# Patient Record
Sex: Female | Born: 1990 | Race: Black or African American | Hispanic: No | Marital: Married | Smoking: Never smoker
Health system: Southern US, Community
[De-identification: ages and names within clinical notes are randomized; demographics above are authoritative.]

## PROBLEM LIST (undated history)

## (undated) DIAGNOSIS — J45909 Unspecified asthma, uncomplicated: Secondary | ICD-10-CM

## (undated) HISTORY — PX: TUBAL LIGATION: SHX77

---
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
## 2012-07-03 ENCOUNTER — Emergency Department (HOSPITAL_COMMUNITY)
Admission: EM | Admit: 2012-07-03 | Discharge: 2012-07-03 | Disposition: A | Discharge status: Home / Self Care | Payer: Managed Care, Other (non HMO) | Attending: Emergency Medicine | Admitting: Emergency Medicine

## 2012-07-03 ENCOUNTER — Encounter (HOSPITAL_COMMUNITY): Payer: Self-pay | Admitting: Emergency Medicine

## 2012-07-03 ENCOUNTER — Emergency Department (HOSPITAL_COMMUNITY): Payer: Managed Care, Other (non HMO)

## 2012-07-03 DIAGNOSIS — Z881 Allergy status to other antibiotic agents status: Secondary | ICD-10-CM | POA: Insufficient documentation

## 2012-07-03 DIAGNOSIS — J45909 Unspecified asthma, uncomplicated: Secondary | ICD-10-CM | POA: Insufficient documentation

## 2012-07-03 DIAGNOSIS — X500XXA Overexertion from strenuous movement or load, initial encounter: Secondary | ICD-10-CM | POA: Insufficient documentation

## 2012-07-03 DIAGNOSIS — S93409A Sprain of unspecified ligament of unspecified ankle, initial encounter: Secondary | ICD-10-CM | POA: Insufficient documentation

## 2012-07-03 HISTORY — DX: Unspecified asthma, uncomplicated: J45.909

## 2012-07-03 MED ORDER — IBUPROFEN 600 MG PO TABS: 600.0000 mg | ORAL_TABLET | Freq: Four times a day (QID) | ORAL | Status: AC | PRN

## 2012-07-03 MED ORDER — HYDROCODONE-ACETAMINOPHEN 5-325 MG PO TABS
1.0000 | ORAL_TABLET | Freq: Once | ORAL | Status: AC
  Administered 2012-07-03: 1 via ORAL
  Filled 2012-07-03: qty 1

## 2012-07-03 MED ORDER — HYDROCODONE-ACETAMINOPHEN 5-325 MG PO TABS: ORAL_TABLET | ORAL | Status: AC

## 2012-07-03 NOTE — ED Provider Notes (Signed)
History     CSN: [id number]  Arrival date & time 07/03/12  2145   First MD Initiated Contact with Patient 07/03/12 2217      Chief Complaint  Patient presents with  . Ankle Pain    (Consider location/radiation/quality/duration/timing/severity/associated sxs/prior treatment) Patient is a 21 y.o. female presenting with ankle pain. The history is provided by the patient.  Ankle Pain  The incident occurred 3 to 5 hours ago. The incident occurred at home. The injury mechanism was torsion. The pain is present in the left ankle. The quality of the pain is described as aching. The pain is moderate. The pain has been constant since onset. Associated symptoms include inability to bear weight. Pertinent negatives include no numbness, no loss of motion, no muscle weakness, no loss of sensation and no tingling. She reports no foreign bodies present. The symptoms are aggravated by activity, bearing weight and palpation. She has tried nothing for the symptoms. The treatment provided no relief.    Past Medical History  Diagnosis Date  . Asthma     History reviewed. No pertinent past surgical history.  History reviewed. No pertinent family history.  History  Substance Use Topics  . Smoking status: Never Smoker   . Smokeless tobacco: Not on file  . Alcohol Use: No    OB History    Grav Para Term Preterm Abortions TAB SAB Ect Mult Living                  Review of Systems  Constitutional: Negative for fever and chills.  Genitourinary: Negative for dysuria and difficulty urinating.  Musculoskeletal: Positive for joint swelling and arthralgias.  Skin: Negative for color change and wound.  Neurological: Negative for tingling and numbness.  All other systems reviewed and are negative.    Allergies  Amoxicillin  Home Medications  No current outpatient prescriptions on file.  BP 102/68  Pulse 115  Temp 98.6 F (37 C) (Oral)  Resp 24  Ht 5\' 4"  (1.626 m)  Wt 100 lb (45.36 kg)   BMI 17.17 kg/m2  SpO2 96%  Physical Exam  Nursing note and vitals reviewed. Constitutional: She is oriented to person, place, and time. She appears well-developed and well-nourished. No distress.  HENT:  Head: Normocephalic and atraumatic.  Cardiovascular: Normal rate, regular rhythm, normal heart sounds and intact distal pulses.   Pulmonary/Chest: Effort normal and breath sounds normal.  Musculoskeletal: She exhibits edema and tenderness.       Left ankle: She exhibits swelling. She exhibits normal range of motion, no ecchymosis, no deformity, no laceration and normal pulse. tenderness. Lateral malleolus tenderness found. No head of 5th metatarsal and no proximal fibula tenderness found. Achilles tendon normal.       Feet:       ttp of the lateral left ankle, mild STS is present.  ROM is preserved.  DP pulse is brisk, sensation intact.  No erythema, abrasion, bruising or deformity.    Neurological: She is alert and oriented to person, place, and time. She exhibits normal muscle tone. Coordination normal.  Skin: Skin is warm and dry.    ED Course  Procedures (including critical care time)  Labs Reviewed - No data to display Dg Ankle Complete Left  07/03/2012  *RADIOLOGY REPORT*  Clinical Data:  Ankle pain, injury  LEFT ANKLE COMPLETE - 3+ VIEW  Comparison:  None.  Findings:  There is no evidence of fracture, dislocation, or joint effusion.  There is no evidence of arthropathy  or other focal bone abnormality.  Soft tissues are unremarkable.  IMPRESSION: Negative.   Original Report Authenticated By: Camelia Phenes, M.D.      ASO splint applied.  Crutches given.  Pain improved, remains NV intact.     MDM    Tender to palpation lateral left ankle. Slight bruising is present. No obvious deformities, no soft tissue swelling. Likely sprain of the ankle. No proximal tenderness.  Patient agrees to elevate and apply ice and followup with Dr. Romeo Apple. The patient appears reasonably screened  and/or stabilized for discharge and I doubt any other medical condition or other [facility] requiring further screening, evaluation, or treatment in the ED at this time prior to discharge.   Prescribed: Ibuprofen Norco #10  Tyshun Tuckerman L. [place], [place] 07/05/12 2024

## 2012-07-03 NOTE — ED Notes (Signed)
Patient states she fell in a hole and twisted her left ankle. Complaining of pain with movement.

## 2012-07-06 NOTE — ED Provider Notes (Signed)
Medical screening examination/treatment/procedure(s) were performed by non-physician practitioner and as supervising physician I was immediately available for consultation/collaboration.  Bristyl Mclees, MD 07/06/12 0706 

## 2012-11-19 ENCOUNTER — Encounter (HOSPITAL_COMMUNITY): Payer: Self-pay | Admitting: Emergency Medicine

## 2012-11-19 ENCOUNTER — Emergency Department (HOSPITAL_COMMUNITY)
Admission: EM | Admit: 2012-11-19 | Discharge: 2012-11-19 | Disposition: A | Discharge status: Home / Self Care | Payer: Managed Care, Other (non HMO) | Attending: Emergency Medicine | Admitting: Emergency Medicine

## 2012-11-19 ENCOUNTER — Emergency Department (HOSPITAL_COMMUNITY): Payer: Managed Care, Other (non HMO)

## 2012-11-19 DIAGNOSIS — J45909 Unspecified asthma, uncomplicated: Secondary | ICD-10-CM | POA: Insufficient documentation

## 2012-11-19 DIAGNOSIS — Z3202 Encounter for pregnancy test, result negative: Secondary | ICD-10-CM | POA: Insufficient documentation

## 2012-11-19 DIAGNOSIS — IMO0002 Reserved for concepts with insufficient information to code with codable children: Secondary | ICD-10-CM | POA: Insufficient documentation

## 2012-11-19 DIAGNOSIS — M25562 Pain in left knee: Secondary | ICD-10-CM

## 2012-11-19 DIAGNOSIS — M79609 Pain in unspecified limb: Secondary | ICD-10-CM | POA: Insufficient documentation

## 2012-11-19 NOTE — ED Notes (Signed)
Patient complaining of left knee pain x 2 days. Reports was given 800 mg ibuprofen by pmd, denies relief.
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
## 2012-11-19 NOTE — ED Provider Notes (Signed)
Medical screening examination/treatment/procedure(s) were performed by non-physician practitioner and as supervising physician I was immediately available for consultation/collaboration.  Trystin Hargrove R. [place], MD 11/19/12 2318 

## 2012-11-19 NOTE — ED Notes (Signed)
Lt knee pain for 2 days, no known injury

## 2013-03-19 ENCOUNTER — Emergency Department: Payer: Self-pay | Admitting: Emergency Medicine

## 2013-03-19 LAB — URINALYSIS, COMPLETE
Blood: NEGATIVE
Glucose,UR: NEGATIVE mg/dL (ref 0–75)
Ketone: NEGATIVE
Specific Gravity: 1.016 (ref 1.003–1.030)
Squamous Epithelial: 37
WBC UR: 8 /HPF (ref 0–5)

## 2013-03-22 LAB — URINE CULTURE

## 2013-05-10 ENCOUNTER — Emergency Department: Payer: Self-pay | Admitting: Emergency Medicine

## 2013-06-01 ENCOUNTER — Emergency Department: Payer: Self-pay | Admitting: Emergency Medicine

## 2013-07-31 ENCOUNTER — Emergency Department: Payer: Self-pay | Admitting: Emergency Medicine

## 2013-07-31 LAB — CBC
HCT: 38.6 % (ref 35.0–47.0)
MCH: 31.1 pg (ref 26.0–34.0)
MCHC: 34.5 g/dL (ref 32.0–36.0)
MCV: 90 fL (ref 80–100)
RBC: 4.27 10*6/uL (ref 3.80–5.20)

## 2013-07-31 LAB — URINALYSIS, COMPLETE
Bacteria: NONE SEEN
Bilirubin,UR: NEGATIVE
Glucose,UR: NEGATIVE mg/dL (ref 0–75)
Hyaline Cast: 2
Leukocyte Esterase: NEGATIVE
Nitrite: NEGATIVE
Ph: 6 (ref 4.5–8.0)
Protein: 25
RBC,UR: 271 /HPF (ref 0–5)
Squamous Epithelial: 6
WBC UR: 11 /HPF (ref 0–5)

## 2013-07-31 LAB — BASIC METABOLIC PANEL
Anion Gap: 6 — ABNORMAL LOW (ref 7–16)
Chloride: 105 mmol/L (ref 98–107)
Creatinine: 0.71 mg/dL (ref 0.60–1.30)
EGFR (African American): >60
EGFR (Non-African Amer.): >60
Glucose: 79 mg/dL (ref 65–99)
Osmolality: 273 (ref 275–301)
Potassium: 3.2 mmol/L — ABNORMAL LOW (ref 3.5–5.1)
Sodium: 139 mmol/L (ref 136–145)

## 2013-10-23 ENCOUNTER — Emergency Department: Payer: Self-pay | Admitting: Emergency Medicine

## 2014-04-14 ENCOUNTER — Observation Stay: Payer: Self-pay | Admitting: Obstetrics and Gynecology

## 2014-04-14 DIAGNOSIS — K649 Unspecified hemorrhoids: Secondary | ICD-10-CM | POA: Insufficient documentation

## 2014-04-14 DIAGNOSIS — H449 Unspecified disorder of globe: Secondary | ICD-10-CM | POA: Insufficient documentation

## 2014-04-14 LAB — URINALYSIS, COMPLETE
Bacteria: NONE SEEN
Bilirubin,UR: NEGATIVE
Blood: NEGATIVE
Glucose,UR: NEGATIVE mg/dL (ref 0–75)
KETONE: NEGATIVE
NITRITE: NEGATIVE
PROTEIN: NEGATIVE
Ph: 7 (ref 4.5–8.0)
RBC,UR: NONE SEEN /HPF (ref 0–5)
Specific Gravity: 1.005 (ref 1.003–1.030)
Squamous Epithelial: 4

## 2014-04-17 ENCOUNTER — Observation Stay: Payer: Self-pay | Admitting: Obstetrics and Gynecology

## 2014-12-28 IMAGING — CR RIGHT THUMB 2+V
1 series · 3 of 3 positions shown · non-contrast
Comparison: none

REASON FOR EXAM: pain
COMMENTS:

[Series 1: x finger pa right · 0.14mm/px · 3 of 3 slices shown]
[im 1/3]
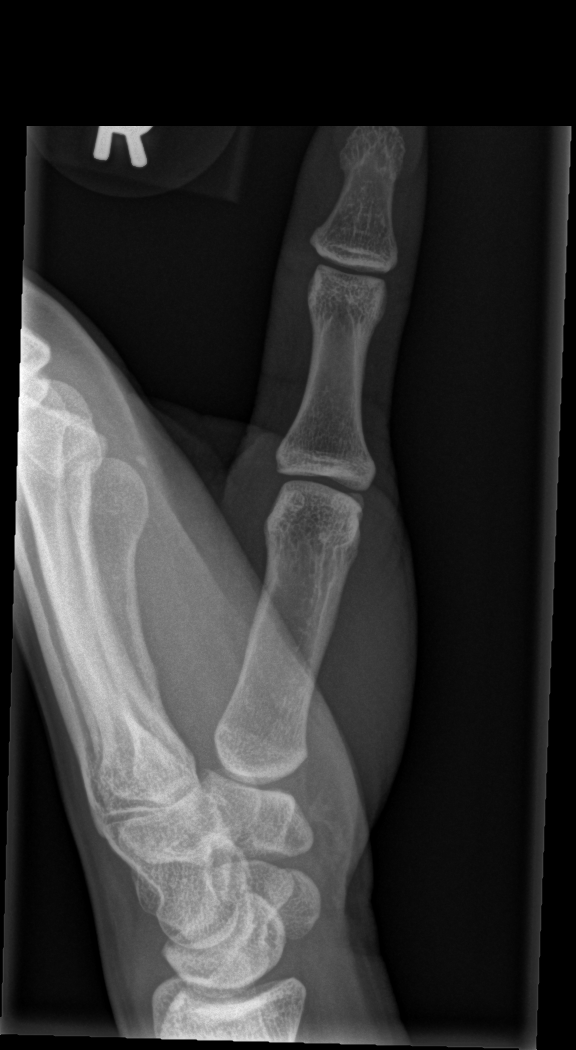
[im 2/3]
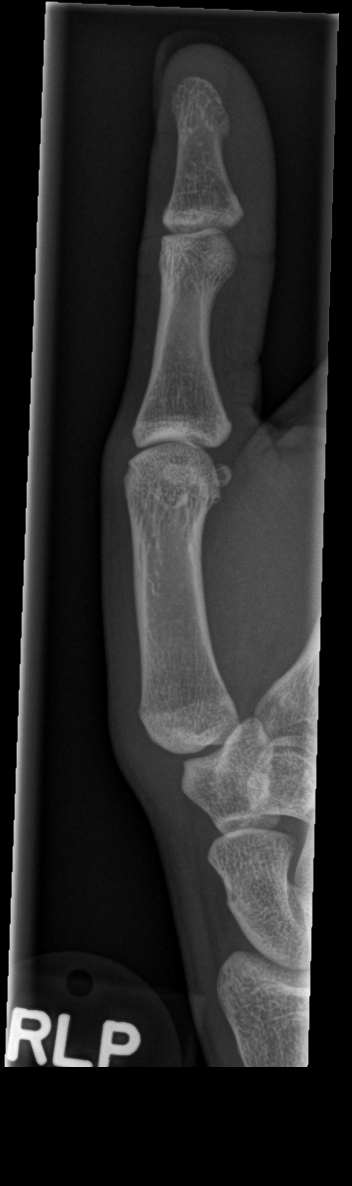
[im 3/3]
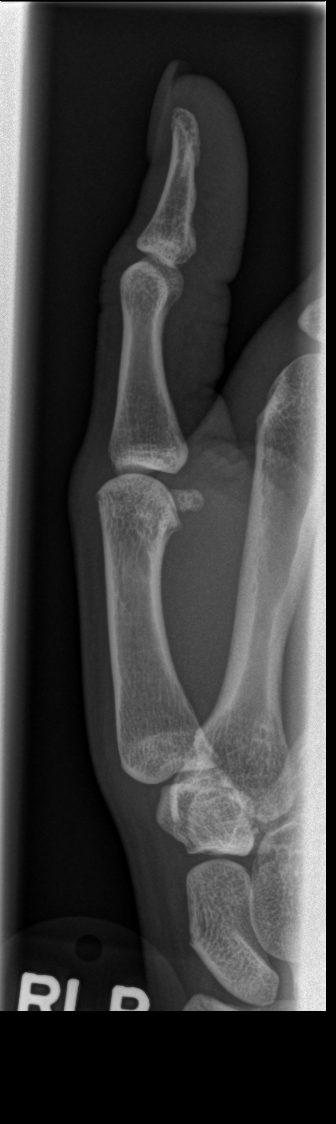

[3 of 3 positions shown; findings below may reference images not displayed]

PROCEDURE:     DXR - DXR THUMB RIGHT HAND (1ST DIGIT)  - May 10, 2013  [DATE]

RESULT:     Three views of the right thumb reveal the bones to be adequately
mineralized. There is no evidence of an acute fracture. The interphalangeal
joint, the metacarpophalangeal joint, and the carpometacarpal joint appear
normal. The overlying soft tissues are normal in appearance.
IMPRESSION: Normal 3 view series of the right thumb.

## 2015-03-24 NOTE — H&P (Signed)
L&D Evaluation:  History:  HPI 22 yoG2P1001, EGA 37.3 weeks, estimated date of confinement 05/02/2014   Presents with contractions   Patient's Medical History No Chronic Illness  GBS+   Patient's Surgical History none   Medications Pre Natal Vitamins   Allergies NKDA   Social History none   Family History Non-Contributory   ROS:  ROS All systems were reviewed.  HEENT, CNS, GI, GU, Respiratory, CV, Renal and Musculoskeletal systems were found to be normal., No PIH symptoms   Exam:  Vital Signs stable   Urine Protein negative dipstick   General no apparent distress   Mental Status clear   Heart normal sinus rhythm   Abdomen gravid, non-tender   Estimated Fetal Weight Average for gestational age, 6#8   Edema no edema   Pelvic no external lesions   Mebranes Intact   FHT normal rate with no decels, Negative spontaneous CST   Ucx regular   Ucx Frequency -2 min   Skin dry   Lymph no lymphadenopathy   Other O+/atb-/NR/RI/VI/HB-/HIV-/GBS POSITIVE   Impression:  Impression early labor, GBS +   Plan:  Plan monitor contractions and for cervical change   Follow Up Appointment already scheduled   Electronic Signatures: Erek Kowal, Prentice DockerMartin A (MD)  (Signed 01-Jun-15 14:47)  Authored: L&D Evaluation   Last Updated: 01-Jun-15 14:47 by Ellery Tash, Prentice DockerMartin A (MD)

## 2015-04-26 DIAGNOSIS — Y9389 Activity, other specified: Secondary | ICD-10-CM | POA: Insufficient documentation

## 2015-04-26 DIAGNOSIS — Z880 Allergy status to penicillin: Secondary | ICD-10-CM | POA: Insufficient documentation

## 2015-04-26 DIAGNOSIS — M25562 Pain in left knee: Secondary | ICD-10-CM | POA: Diagnosis present

## 2015-04-26 DIAGNOSIS — M7052 Other bursitis of knee, left knee: Secondary | ICD-10-CM | POA: Insufficient documentation

## 2015-04-26 DIAGNOSIS — G8929 Other chronic pain: Secondary | ICD-10-CM | POA: Diagnosis not present

## 2015-04-27 ENCOUNTER — Emergency Department
Admission: EM | Admit: 2015-04-27 | Discharge: 2015-04-27 | Disposition: A | Discharge status: Home / Self Care | Payer: Managed Care, Other (non HMO) | Attending: Emergency Medicine | Admitting: Emergency Medicine

## 2015-04-27 ENCOUNTER — Encounter: Payer: Self-pay | Admitting: *Deleted

## 2015-04-27 DIAGNOSIS — M7051 Other bursitis of knee, right knee: Secondary | ICD-10-CM

## 2015-04-27 DIAGNOSIS — M25561 Pain in right knee: Secondary | ICD-10-CM

## 2015-04-27 MED ORDER — HYDROCODONE-ACETAMINOPHEN 5-325 MG PO TABS
ORAL_TABLET | ORAL | Status: AC
  Filled 2015-04-27: qty 1

## 2015-04-27 MED ORDER — IBUPROFEN 600 MG PO TABS: 600.0000 mg | ORAL_TABLET | Freq: Three times a day (TID) | ORAL | Status: DC | PRN

## 2015-04-27 MED ORDER — IBUPROFEN 600 MG PO TABS
ORAL_TABLET | ORAL | Status: AC
  Filled 2015-04-27: qty 1

## 2015-04-27 MED ORDER — IBUPROFEN 600 MG PO TABS
600.0000 mg | ORAL_TABLET | Freq: Once | ORAL | Status: AC
  Administered 2015-04-27: 600 mg via ORAL

## 2015-04-27 MED ORDER — HYDROCODONE-ACETAMINOPHEN 5-325 MG PO TABS: 1.0000 | ORAL_TABLET | Freq: Four times a day (QID) | ORAL | Status: DC | PRN

## 2015-04-27 MED ORDER — HYDROCODONE-ACETAMINOPHEN 5-325 MG PO TABS
1.0000 | ORAL_TABLET | Freq: Once | ORAL | Status: AC
  Administered 2015-04-27: 1 via ORAL

## 2015-04-27 NOTE — ED Notes (Signed)
Pt presents w/ exacerbation of chronic knee complaint. Pt states swelling to posterior L knee and L ankle are worse. Pt states she has been to physical therapy and it is not improving. Pt c/o pain related to swelling. Pt is ambulatory w/o acute distress in triage.

## 2015-04-27 NOTE — ED Provider Notes (Signed)
[facility] Emergency Department Provider Note  ____________________________________________  Time seen: Approximately 5:01 AM  I have reviewed the triage vital signs and the nursing notes.   HISTORY  Chief Complaint Joint Swelling    HPI Julie Lawson is a 24 y.o. female who presents with acute on chronic left knee pain. Patient states she has been hurting "for a minute". Has been having chronic knee and ankle pain on the left side. Has been to physical therapy and thinks she exacerbated it this week when she started a new job which requires standing for prolonged periods of time. Complains of 6/10 pain and swelling to left lateral knee. Pain is worse on movement. Denies chest pain, shortness of breath, vomiting, diarrhea, weakness, numbness, tingling. Denies recent travel or surgery. Does take Depo shot.   Past Medical History  Diagnosis Date  . Asthma     There are no active problems to display for this patient.   History reviewed. No pertinent past surgical history.  Current Outpatient Rx  Name  Route  Sig  Dispense  Refill  . albuterol (PROVENTIL HFA;VENTOLIN HFA) 108 (90 BASE) MCG/ACT inhaler   Inhalation   Inhale 2 puffs into the lungs every 6 (six) hours as needed. For shortness of breath         . ibuprofen (ADVIL,MOTRIN) 200 MG tablet   Oral   Take 200-400 mg by mouth once as needed. For pain           Allergies Amoxicillin and Bee venom  History reviewed. No pertinent family history.  No family history of PE/DVT  Social History History  Substance Use Topics  . Smoking status: Never Smoker   . Smokeless tobacco: Not on file  . Alcohol Use: No    Review of Systems Constitutional: No fever/chills Eyes: No visual changes. ENT: No sore throat. Cardiovascular: Denies chest pain. Respiratory: Denies shortness of breath. Gastrointestinal: No abdominal pain.  No nausea, no vomiting.  No diarrhea.  No  constipation. Genitourinary: Negative for dysuria. Musculoskeletal: Positive for left knee and ankle pain Negative for back pain. Skin: Negative for rash. Neurological: Negative for headaches, focal weakness or numbness.  10-point ROS otherwise negative.  ____________________________________________   PHYSICAL EXAM:  VITAL SIGNS: ED Triage Vitals  Enc Vitals Group     BP 04/27/15 0001 107/70 mmHg     Pulse Rate 04/27/15 0001 77     Resp 04/27/15 0001 16     Temp 04/27/15 0001 98.3 F (36.8 C)     Temp Source 04/27/15 0001 Oral     SpO2 04/27/15 0001 100 %     Weight 04/27/15 0001 111 lb (50.349 kg)     Height 04/27/15 0001 5\' 1"  (1.549 m)     Head Cir --      Peak Flow --      Pain Score 04/27/15 0002 6     Pain Loc --      Pain Edu? --      Excl. in GC? --     Constitutional: Alert and oriented. Well appearing and in no acute distress. Eyes: Conjunctivae are normal. PERRL. EOMI. Head: Atraumatic. Nose: No congestion/rhinnorhea. Mouth/Throat: Mucous membranes are moist.  Oropharynx non-erythematous. Neck: No stridor.   Cardiovascular: Normal rate, regular rhythm. Grossly normal heart sounds.  Good peripheral circulation. Respiratory: Normal respiratory effort.  No retractions. Lungs CTAB. Gastrointestinal: Soft and nontender. No distention. No abdominal bruits. No CVA tenderness. Musculoskeletal: Left knee: Slight effusion left lateral  knee with tenderness to palpation. No calf swelling. Negative Homans sign. Full range of motion with minimal pain. Leg is supple without evidence of compartment syndrome. Limb is symmetrically warm without signs of ischemia. Neurologic:  Normal speech and language. No gross focal neurologic deficits are appreciated. Speech is normal. No gait instability. Skin:  Skin is warm, dry and intact. No rash noted. Psychiatric: Mood and affect are normal. Speech and behavior are normal.  ____________________________________________   LABS (all  labs ordered are listed, but only abnormal results are displayed)  Labs Reviewed - No data to display ____________________________________________  EKG  None ____________________________________________  RADIOLOGY  None ____________________________________________   PROCEDURES  Procedure(s) performed: None  Critical Care performed: No  ____________________________________________   INITIAL IMPRESSION / ASSESSMENT AND PLAN / ED COURSE  Pertinent labs & imaging results that were available during my care of the patient were reviewed by me and considered in my medical decision making (see chart for details).  24 year old female with acute on chronic left knee pain which is worse on movement and prolonged standing since she began a new job one week ago. Will treat with NSAIDs and analgesia; follow-up orthopedics. Strict return precautions given. Patient and spouse verbalized understanding and agree with plan of care. ____________________________________________   FINAL CLINICAL IMPRESSION(S) / ED DIAGNOSES  Final diagnoses:  Bursitis of knee, right  Knee pain, acute, right      Irean Hong, MD 04/27/15 [phone]

## 2015-04-27 NOTE — Discharge Instructions (Signed)
1. Take pain medicines as needed (Motrin/Norco #15). 2. You may remove compressive wrap for bathing and sleeping.  3. Return to the ER for worsening symptoms, increased swelling, weakness or other concerns.  Knee Pain The knee is the complex joint between your thigh and your lower leg. It is made up of bones, tendons, ligaments, and cartilage. The bones that make up the knee are:  The femur in the thigh.  The tibia and fibula in the lower leg.  The patella or kneecap riding in the groove on the lower femur. CAUSES  Knee pain is a common complaint with many causes. A few of these causes are:  Injury, such as:  A ruptured ligament or tendon injury.  Torn cartilage.  Medical conditions, such as:  Gout  Arthritis  Infections  Overuse, over training, or overdoing a physical activity. Knee pain can be minor or severe. Knee pain can accompany debilitating injury. Minor knee problems often respond well to self-care measures or get well on their own. More serious injuries may need medical intervention or even surgery. SYMPTOMS The knee is complex. Symptoms of knee problems can vary widely. Some of the problems are:  Pain with movement and weight bearing.  Swelling and tenderness.  Buckling of the knee.  Inability to straighten or extend your knee.  Your knee locks and you cannot straighten it.  Warmth and redness with pain and fever.  Deformity or dislocation of the kneecap. DIAGNOSIS  Determining what is wrong may be very straight forward such as when there is an injury. It can also be challenging because of the complexity of the knee. Tests to make a diagnosis may include:  Your caregiver taking a history and doing a physical exam.  Routine X-rays can be used to rule out other problems. X-rays will not reveal a cartilage tear. Some injuries of the knee can be diagnosed by:  Arthroscopy a surgical technique by which a small video camera is inserted through tiny incisions  on the sides of the knee. This procedure is used to examine and repair internal knee joint problems. Tiny instruments can be used during arthroscopy to repair the torn knee cartilage (meniscus).  Arthrography is a radiology technique. A contrast liquid is directly injected into the knee joint. Internal structures of the knee joint then become visible on X-ray film.  An MRI scan is a non X-ray radiology procedure in which magnetic fields and a computer produce two- or three-dimensional images of the inside of the knee. Cartilage tears are often visible using an MRI scanner. MRI scans have largely replaced arthrography in diagnosing cartilage tears of the knee.  Blood work.  Examination of the fluid that helps to lubricate the knee joint (synovial fluid). This is done by taking a sample out using a needle and a syringe. TREATMENT The treatment of knee problems depends on the cause. Some of these treatments are:  Depending on the injury, proper casting, splinting, surgery, or physical therapy care will be needed.  Give yourself adequate recovery time. Do not overuse your joints. If you begin to get sore during workout routines, back off. Slow down or do fewer repetitions.  For repetitive activities such as cycling or running, maintain your strength and nutrition.  Alternate muscle groups. For example, if you are a weight lifter, work the upper body on one day and the lower body the next.  Either tight or weak muscles do not give the proper support for your knee. Tight or weak muscles do  not absorb the stress placed on the knee joint. Keep the muscles surrounding the knee strong.  Take care of mechanical problems.  If you have flat feet, orthotics or special shoes may help. See your caregiver if you need help.  Arch supports, sometimes with wedges on the inner or outer aspect of the heel, can help. These can shift pressure away from the side of the knee most bothered by osteoarthritis.  A  brace called an "unloader" brace also may be used to help ease the pressure on the most arthritic side of the knee.  If your caregiver has prescribed crutches, braces, wraps or ice, use as directed. The acronym for this is PRICE. This means protection, rest, ice, compression, and elevation.  Nonsteroidal anti-inflammatory drugs (NSAIDs), can help relieve pain. But if taken immediately after an injury, they may actually increase swelling. Take NSAIDs with food in your stomach. Stop them if you develop stomach problems. Do not take these if you have a history of ulcers, stomach pain, or bleeding from the bowel. Do not take without your caregiver's approval if you have problems with fluid retention, heart failure, or kidney problems.  For ongoing knee problems, physical therapy may be helpful.  Glucosamine and chondroitin are over-the-counter dietary supplements. Both may help relieve the pain of osteoarthritis in the knee. These medicines are different from the usual anti-inflammatory drugs. Glucosamine may decrease the rate of cartilage destruction.  Injections of a corticosteroid drug into your knee joint may help reduce the symptoms of an arthritis flare-up. They may provide pain relief that lasts a few months. You may have to wait a few months between injections. The injections do have a small increased risk of infection, water retention, and elevated blood sugar levels.  Hyaluronic acid injected into damaged joints may ease pain and provide lubrication. These injections may work by reducing inflammation. A series of shots may give relief for as long as 6 months.  Topical painkillers. Applying certain ointments to your skin may help relieve the pain and stiffness of osteoarthritis. Ask your pharmacist for suggestions. Many over the-counter products are approved for temporary relief of arthritis pain.  In some countries, doctors often prescribe topical NSAIDs for relief of chronic conditions such as  arthritis and tendinitis. A review of treatment with NSAID creams found that they worked as well as oral medications but without the serious side effects. PREVENTION  Maintain a healthy weight. Extra pounds put more strain on your joints.  Get strong, stay limber. Weak muscles are a common cause of knee injuries. Stretching is important. Include flexibility exercises in your workouts.  Be smart about exercise. If you have osteoarthritis, chronic knee pain or recurring injuries, you may need to change the way you exercise. This does not mean you have to stop being active. If your knees ache after jogging or playing basketball, consider switching to swimming, water aerobics, or other low-impact activities, at least for a few days a week. Sometimes limiting high-impact activities will provide relief.  Make sure your shoes fit well. Choose footwear that is right for your sport.  Protect your knees. Use the proper gear for knee-sensitive activities. Use kneepads when playing volleyball or laying carpet. Buckle your seat belt every time you drive. Most shattered kneecaps occur in car accidents.  Rest when you are tired. SEEK MEDICAL CARE IF:  You have knee pain that is continual and does not seem to be getting better.  SEEK IMMEDIATE MEDICAL CARE IF:  Your knee joint  feels hot to the touch and you have a high fever. MAKE SURE YOU:   Understand these instructions.  Will watch your condition.  Will get help right away if you are not doing well or get worse. Document Released: 08/28/2007 Document Revised: 01/23/2012 Document Reviewed: 08/28/2007 [facility] Patient Information 2015 [place], [place]. This information is not intended to replace advice given to you by your health care provider. Make sure you discuss any questions you have with your health care provider.  Bursitis Bursitis is inflammation of a bursa. A bursa is a soft, fluid-filled sac. It cushions the soft tissue around a bone. Bursitis  often occurs in the bursas near the shoulders, elbows, knees, pelvis, hips, heel, and Achilles tendon.  SYMPTOMS   Pain and tenderness in the affected area. Sometimes, pain radiates into surrounding areas. Specifically, pain with movement.  Limited range of motion of the affected joint.  Sometimes, painless swelling of the bursa.  Fever (when infected). CAUSES   Injury to a joint or bursa.  Overuse or strenuous exercise of a joint.  Gout (disease with inflamed joints).  Prolonged pressure on a joint containing bursas (resting on an elbow or kneeling).  Arthritis.  Acute or chronic infection.  Calcium deposits in shoulder tendons, with degeneration of the tendon. RISK INCREASES WITH:  Vigorous, repeated, or sudden increase in athletic training or activity level.  Failure to warm up properly.  Overstretching.  Improper exercise technique.  Playing sports on [employer]. PREVENTION  Avoid injuries or overuse of muscles.  Warm up and cool down properly. Do this before and after physical activity.  Maintain proper conditioning:  Joint flexibility.  Muscle strength and endurance.  Cardiovascular fitness.  Learn and use proper technique.  Wear protective equipment. PROGNOSIS  With proper treatment, symptoms often go away within 7 to 14 days.  RELATED COMPLICATIONS   Frequent recurrence of symptoms. This can result in a chronic, repetitive problem.  Joint stiffness.  Limited joint movement.  Infection of bursa.  Chronic inflammation or scarring of bursa. TREATMENT Treatment first involves protecting and resting the bursa and its joint. You may use ice or an elastic bandage to reduce inflammation. Anti-inflammatory medicines may help resolve the swelling. If symptoms persist despite treatment, a caregiver may withdraw fluid from the bursa. They might also consider a corticosteroid injection. Sometimes, bursitis will persist in spite of nonsurgical treatment or  will become infected. These cases may require removal (surgical excision) of the bursa.  MEDICATION   If pain medicine is needed, nonsteroidal anti-inflammatory medicines, such as aspirin and ibuprofen, or other minor pain relievers, such as acetaminophen, are often recommended.  Do not take pain medicine for 7 days before surgery.  Prescription pain relievers are usually only prescribed after surgery. Use only as directed and only as much as you need.  Ointments applied to the skin may be helpful.  Corticosteroid injections may be given. This is done to reduce inflammation in the bursa. HEAT AND COLD:  Cold treatment (icing) relieves pain and reduces inflammation. Cold treatment should be applied for 10 to 15 minutes every 2 to 3 hours for inflammation and pain, and immediately after any activity that aggravates your symptoms. Use ice packs or an ice massage.  Heat treatment may be used prior to performing the stretching and strengthening activities prescribed by your caregiver, physical therapist, or athletic trainer. Use a heat pack or a warm soak. SEEK MEDICAL CARE IF:   Symptoms get worse or do not improve in 2 weeks,  despite treatment.  New, unexplained symptoms develop. (Drugs used in treatment may produce side effects.) Document Released: 10/31/2005 Document Revised: 03/17/2014 Document Reviewed: 02/12/2009 [facility] Patient Information 2015 [place], [place]. This information is not intended to replace advice given to you by your health care provider. Make sure you discuss any questions you have with your health care provider.

## 2015-12-12 ENCOUNTER — Emergency Department
Admission: EM | Admit: 2015-12-12 | Discharge: 2015-12-12 | Disposition: A | Discharge status: Home / Self Care | Payer: Managed Care, Other (non HMO) | Attending: Emergency Medicine | Admitting: Emergency Medicine

## 2015-12-12 ENCOUNTER — Encounter: Payer: Self-pay | Admitting: Emergency Medicine

## 2015-12-12 DIAGNOSIS — Z331 Pregnant state, incidental: Secondary | ICD-10-CM | POA: Insufficient documentation

## 2015-12-12 DIAGNOSIS — K6289 Other specified diseases of anus and rectum: Secondary | ICD-10-CM | POA: Diagnosis present

## 2015-12-12 DIAGNOSIS — Z880 Allergy status to penicillin: Secondary | ICD-10-CM | POA: Diagnosis not present

## 2015-12-12 DIAGNOSIS — K644 Residual hemorrhoidal skin tags: Secondary | ICD-10-CM | POA: Insufficient documentation

## 2015-12-12 MED ORDER — HYDROCORTISONE ACETATE 25 MG RE SUPP: 25.0000 mg | Freq: Two times a day (BID) | RECTAL | Status: AC

## 2015-12-12 NOTE — ED Notes (Signed)
States had hemorrhoids during pregnancy 2015.  Does not usually have bleeding from them. Today x 2 had bleeding from hemorroids x 2 when had a bowel movement. Does not have bleeding between bowel movements.

## 2015-12-12 NOTE — ED Notes (Signed)
Pt discharged to home.  Family member driving.  Discharge instructions reviewed.  Verbalized understanding.  No questions or concerns at this time.  Teach back verified.  Pt in NAD.  No items left in ED.   

## 2015-12-12 NOTE — Discharge Instructions (Signed)
Hemorrhoids °Hemorrhoids are swollen veins around the rectum or anus. There are two types of hemorrhoids:  °· Internal hemorrhoids. These occur in the veins just inside the rectum. They may poke through to the outside and become irritated and painful. °· External hemorrhoids. These occur in the veins outside the anus and can be felt as a painful swelling or hard lump near the anus. °CAUSES °· Pregnancy.   °· Obesity.   °· Constipation or diarrhea.   °· Straining to have a bowel movement.   °· Sitting for long periods on the toilet. °· Heavy lifting or other activity that caused you to strain. °· Anal intercourse. °SYMPTOMS  °· Pain.   °· Anal itching or irritation.   °· Rectal bleeding.   °· Fecal leakage.   °· Anal swelling.   °· One or more lumps around the anus.   °DIAGNOSIS  °Your caregiver may be able to diagnose hemorrhoids by visual examination. Other examinations or tests that may be performed include:  °· Examination of the rectal area with a gloved hand (digital rectal exam).   °· Examination of anal canal using a small tube (scope).   °· A blood test if you have lost a significant amount of blood. °· A test to look inside the colon (sigmoidoscopy or colonoscopy). °TREATMENT °Most hemorrhoids can be treated at home. However, if symptoms do not seem to be getting better or if you have a lot of rectal bleeding, your caregiver may perform a procedure to help make the hemorrhoids get smaller or remove them completely. Possible treatments include:  °· Placing a rubber band at the base of the hemorrhoid to cut off the circulation (rubber band ligation).   °· Injecting a chemical to shrink the hemorrhoid (sclerotherapy).   °· Using a tool to burn the hemorrhoid (infrared light therapy).   °· Surgically removing the hemorrhoid (hemorrhoidectomy).   °· Stapling the hemorrhoid to block blood flow to the tissue (hemorrhoid stapling).   °HOME CARE INSTRUCTIONS  °· Eat foods with fiber, such as whole grains, beans,  nuts, fruits, and vegetables. Ask your doctor about taking products with added fiber in them (fiber supplements). °· Increase fluid intake. Drink enough water and fluids to keep your urine clear or pale yellow.   °· Exercise regularly.   °· Go to the bathroom when you have the urge to have a bowel movement. Do not wait.   °· Avoid straining to have bowel movements.   °· Keep the anal area dry and clean. Use wet toilet paper or moist towelettes after a bowel movement.   °· Medicated creams and suppositories may be used or applied as directed.   °· Only take over-the-counter or prescription medicines as directed by your caregiver.   °· Take warm sitz baths for 15-20 minutes, 3-4 times a day to ease pain and discomfort.   °· Place ice packs on the hemorrhoids if they are tender and swollen. Using ice packs between sitz baths may be helpful.   °¨ Put ice in a plastic bag.   °¨ Place a towel between your skin and the bag.   °¨ Leave the ice on for 15-20 minutes, 3-4 times a day.   °· Do not use a donut-shaped pillow or sit on the toilet for long periods. This increases blood pooling and pain.   °SEEK MEDICAL CARE IF: °· You have increasing pain and swelling that is not controlled by treatment or medicine. °· You have uncontrolled bleeding. °· You have difficulty or you are unable to have a bowel movement. °· You have pain or inflammation outside the area of the hemorrhoids. °MAKE SURE YOU: °· Understand these instructions. °·   Will watch your condition. °· Will get help right away if you are not doing well or get worse. °  °This information is not intended to replace advice given to you by your health care provider. Make sure you discuss any questions you have with your health care provider. °  °Document Released: 10/28/2000 Document Revised: 10/17/2012 Document Reviewed: 09/04/2012 °Elsevier Interactive Patient Education ©2016 [employer] ° °High-Fiber Diet °Fiber, also called dietary fiber, is a type of carbohydrate  found in fruits, vegetables, whole grains, and beans. A high-fiber diet can have many health benefits. Your health care provider may recommend a high-fiber diet to help: °· Prevent constipation. Fiber can make your bowel movements more regular. °· Lower your cholesterol. °· Relieve hemorrhoids, uncomplicated diverticulosis, or irritable bowel syndrome. °· Prevent overeating as part of a weight-loss plan. °· Prevent heart disease, type 2 diabetes, and certain cancers. °WHAT IS MY PLAN? °The recommended daily intake of fiber includes: °· 38 grams for men under age 50. °· 30 grams for men over age 50. °· 25 grams for women under age 50. °· 21 grams for women over age 50. °You can get the recommended daily intake of dietary fiber by eating a variety of fruits, vegetables, grains, and beans. Your health care provider may also recommend a fiber supplement if it is not possible to get enough fiber through your diet. °WHAT DO I NEED TO KNOW ABOUT A HIGH-FIBER DIET? °· Fiber supplements have not been widely studied for their effectiveness, so it is better to get fiber through food sources. °· Always check the fiber content on the nutrition facts label of any prepackaged food. Look for foods that contain at least 5 grams of fiber per serving. °· Ask your dietitian if you have questions about specific foods that are related to your condition, especially if those foods are not listed in the following section. °· Increase your daily fiber consumption gradually. Increasing your intake of dietary fiber too quickly may cause bloating, cramping, or gas. °· Drink plenty of water. Water helps you to digest fiber. °WHAT FOODS CAN I EAT? °Grains °Whole-grain breads. Multigrain cereal. Oats and oatmeal. Brown rice. Barley. Bulgur wheat. Millet. Bran muffins. Popcorn. Rye wafer crackers. °Vegetables °Sweet potatoes. Spinach. Kale. Artichokes. Cabbage. Broccoli. Green peas. Carrots. Squash. °Fruits °Berries. Pears. Apples. Oranges.  Avocados. Prunes and raisins. Dried figs. °Meats and Other Protein Sources °Navy, kidney, pinto, and soy beans. Split peas. Lentils. Nuts and seeds. °Dairy °Fiber-fortified yogurt. °Beverages °Fiber-fortified soy milk. Fiber-fortified orange juice. °Other °Fiber bars. °The items listed above may not be a complete list of recommended foods or beverages. Contact your dietitian for more options. °WHAT FOODS ARE NOT RECOMMENDED? °Grains °White bread. Pasta made with refined flour. White rice. °Vegetables °Fried potatoes. Canned vegetables. Well-cooked vegetables.  °Fruits °Fruit juice. Cooked, strained fruit. °Meats and Other Protein Sources °Fatty cuts of meat. Fried poultry or fried fish. °Dairy °Milk. Yogurt. Cream cheese. Sour cream. °Beverages °Soft drinks. °Other °Cakes and pastries. Butter and oils. °The items listed above may not be a complete list of foods and beverages to avoid. Contact your dietitian for more information. °WHAT ARE SOME TIPS FOR INCLUDING HIGH-FIBER FOODS IN MY DIET? °· Eat a wide variety of high-fiber foods. °· Make sure that half of all grains consumed each day are whole grains. °· Replace breads and cereals made from refined flour or white flour with whole-grain breads and cereals. °· Replace white rice with brown rice, bulgur wheat, or millet. °· Start   the day with a breakfast that is high in fiber, such as a cereal that contains at least 5 grams of fiber per serving.  Use beans in place of meat in soups, salads, or pasta.  Eat high-fiber snacks, such as berries, raw vegetables, nuts, or popcorn.   This information is not intended to replace advice given to you by your health care provider. Make sure you discuss any questions you have with your health care provider.   Document Released: 10/31/2005 Document Revised: 11/21/2014 Document Reviewed: 04/15/2014 Elsevier Interactive Patient Education 2016 [employer].  Use the suppository as needed. Increase fiber and consider a  daily fiber supplement or Miralax as needed.

## 2015-12-13 NOTE — ED Provider Notes (Signed)
[facility] Emergency Department Provider Note ____________________________________________  Time seen: 54  I have reviewed the triage vital signs and the nursing notes.  HISTORY  Chief Complaint  Hemorrhoids  HPI Julie Lawson is a 25 y.o. female presents to the ED for evaluation of an episode of bleeding after 2 separate bowel movements today. She does have a history of hemorrhoids since her pregnancy in 2015. She does not give a history of previous bleeding hemorrhoids, nor has she had any bleeding in between bowel movements. She denies any fevers, chills, sweats, nausea, vomiting, or diarrhea. She does admit to a firm, straining stool today with the episodes of rectal pain and bleeding. She was previously evaluated and prescribed a steroid cream,but admits to not using it. She also voices some concerns following unusual bleeding last month. She reports her normal menstrual period on 12/3, and then a another shorter menstrual bleed 2 weeks later. He denies any vaginal discharge, or pelvic pain. She is currently not using any method of birth control or barrier protection. She last utilized Depo-Provera in July 2016.  Past Medical History  Diagnosis Date  . Asthma     There are no active problems to display for this patient.   History reviewed. No pertinent past surgical history.  Current Outpatient Rx  Name  Route  Sig  Dispense  Refill  . albuterol (PROVENTIL HFA;VENTOLIN HFA) 108 (90 BASE) MCG/ACT inhaler   Inhalation   Inhale 2 puffs into the lungs every 6 (six) hours as needed. For shortness of breath         . HYDROcodone-acetaminophen (NORCO) 5-325 MG per tablet   Oral   Take 1 tablet by mouth every 6 (six) hours as needed for moderate pain.   15 tablet   0   . hydrocortisone (ANUSOL-HC) 25 MG suppository   Rectal   Place 1 suppository (25 mg total) rectally every 12 (twelve) hours.   12 suppository   1   . ibuprofen (ADVIL,MOTRIN)  600 MG tablet   Oral   Take 1 tablet (600 mg total) by mouth every 8 (eight) hours as needed.   15 tablet   0    Allergies Amoxicillin and Bee venom  No family history on file.  Social History Social History  Substance Use Topics  . Smoking status: Never Smoker   . Smokeless tobacco: None  . Alcohol Use: No   Review of Systems  Constitutional: Negative for fever. Eyes: Negative for visual changes. ENT: Negative for sore throat. Cardiovascular: Negative for chest pain. Respiratory: Negative for shortness of breath. Gastrointestinal: Negative for abdominal pain, vomiting and diarrhea. Rectal pain and bleeding as above. Genitourinary: Negative for dysuria. LMP  10/17/15 Musculoskeletal: Negative for back pain. Skin: Negative for rash. Neurological: Negative for headaches, focal weakness or numbness. ____________________________________________  PHYSICAL EXAM:  VITAL SIGNS: ED Triage Vitals  Enc Vitals Group     BP 12/12/15 1703 111/71 mmHg     Pulse Rate 12/12/15 1703 73     Resp 12/12/15 1703 18     Temp 12/12/15 1703 98.1 F (36.7 C)     Temp Source 12/12/15 1703 Oral     SpO2 12/12/15 1703 100 %     Weight 12/12/15 1703 100 lb (45.36 kg)     Height 12/12/15 1703  (1.549 m)     Head Cir --      Peak Flow --      Pain Score 12/12/15 1704 5  Pain Loc --      Pain Edu? --      Excl. in GC? --    Constitutional: Alert and oriented. Well appearing and in no distress. Head: Normocephalic and atraumatic.      Eyes: Conjunctivae are normal. PERRL. Normal extraocular movements      Ears: Canals clear. TMs intact bilaterally.   Nose: No congestion/rhinorrhea.   Mouth/Throat: Mucous membranes are moist.   Neck: Supple. No thyromegaly. Hematological/Lymphatic/Immunological: No cervical lymphadenopathy. Cardiovascular: Normal rate, regular rhythm.  Respiratory: Normal respiratory effort. No wheezes/rales/rhonchi. Gastrointestinal: Soft and nontender.  No distention, rebound, guarding, or organomegaly. Normal bowel sounds. Rectal exam reveals as single, soft external hemorrhoid. Digital exam is deferred.  Musculoskeletal: Nontender with normal range of motion in all extremities.  Neurologic:  Normal gait without ataxia. Normal speech and language. No gross focal neurologic deficits are appreciated. Skin:  Skin is warm, dry and intact. No rash noted. Psychiatric: Mood and affect are normal. Patient exhibits appropriate insight and judgment. ____________________________________________   LABS (pertinent positives/negatives) Labs Reviewed  POC URINE PREG, ED   Urine Pregnancy - Positive ____________________________________________  INITIAL IMPRESSION / ASSESSMENT AND PLAN / ED COURSE  Patient is notified of the positive urine pregnancy test. By dates that gives her a gestational age of [redacted] weeks and 6 days. Her EDC is a 07/23/2016. She also has a single external hemorrhoid with some intermittent bleeding. Patient is advised to increase fiber as well as stool softeners. She did bite with a prescription for hydrocortisone suppositories to use as needed. She will follow-up with the primary care provider for ongoing prenatal care. ____________________________________________  FINAL CLINICAL IMPRESSION(S) / ED DIAGNOSES  Final diagnoses:  External hemorrhoid, bleeding      Lissa Hoard, PA-C 12/13/15 2100  Jennye Moccasin, MD 12/13/15 2156

## 2015-12-16 ENCOUNTER — Ambulatory Visit (INDEPENDENT_AMBULATORY_CARE_PROVIDER_SITE_OTHER): Payer: Managed Care, Other (non HMO) | Admitting: Obstetrics and Gynecology

## 2015-12-16 ENCOUNTER — Other Ambulatory Visit (INDEPENDENT_AMBULATORY_CARE_PROVIDER_SITE_OTHER): Payer: Managed Care, Other (non HMO)

## 2015-12-16 ENCOUNTER — Other Ambulatory Visit: Payer: Self-pay

## 2015-12-16 VITALS — BP 102/71 | HR 87 | Wt 110.1 lb

## 2015-12-16 DIAGNOSIS — Z1389 Encounter for screening for other disorder: Secondary | ICD-10-CM

## 2015-12-16 DIAGNOSIS — Z369 Encounter for antenatal screening, unspecified: Secondary | ICD-10-CM

## 2015-12-16 DIAGNOSIS — Z36 Encounter for antenatal screening of mother: Secondary | ICD-10-CM

## 2015-12-16 DIAGNOSIS — N926 Irregular menstruation, unspecified: Secondary | ICD-10-CM

## 2015-12-16 DIAGNOSIS — Z3490 Encounter for supervision of normal pregnancy, unspecified, unspecified trimester: Secondary | ICD-10-CM

## 2015-12-16 DIAGNOSIS — Z331 Pregnant state, incidental: Secondary | ICD-10-CM | POA: Diagnosis not present

## 2015-12-16 DIAGNOSIS — Z113 Encounter for screening for infections with a predominantly sexual mode of transmission: Secondary | ICD-10-CM

## 2015-12-16 DIAGNOSIS — Z3687 Encounter for antenatal screening for uncertain dates: Secondary | ICD-10-CM

## 2015-12-16 DIAGNOSIS — Z3491 Encounter for supervision of normal pregnancy, unspecified, first trimester: Secondary | ICD-10-CM

## 2015-12-16 LAB — POCT URINE PREGNANCY: Preg Test, Ur: POSITIVE — AB

## 2015-12-16 MED ORDER — CONCEPT DHA 53.5-38-1 MG PO CAPS: 1.0000 | ORAL_CAPSULE | Freq: Every day | ORAL | Status: AC

## 2015-12-16 MED ORDER — DOCUSATE SODIUM 100 MG PO CAPS: 100.0000 mg | ORAL_CAPSULE | Freq: Two times a day (BID) | ORAL | Status: AC

## 2015-12-16 NOTE — Progress Notes (Signed)
Julie Lawson presents for NOB nurse interview visit. G-3.  P-2002. Pt seen at [facility] ER on 12/12/2015 for hemorrhoid pain and found out she was pregnant. LMP: 10/16/2015 and had another lighter menses on 11/01/2015. EDD: 07/22/16. Pregnancy education material explained and given. No cats in the home. NOB labs ordered.  HIV labs and Drug screen were explained optional and she could opt out of tests but did not decline. Drug screen ordered. PNV encouraged. NT to discuss with provider. Ultrasound done today for dating and viability and no gestational sac visualized. UPT: POSITIVE today in office. Per [facility] pt to have BHCG done today and again in 48 hrs. If results double will get BHCG's weekly until the results reach 2000, then do an ultrasound. NOB physical appt pending on BHCG's and future ultrasound results.  All questions answered.    ZIKA EXPOSURE SCREEN:  The patient has not traveled to a [place] Virus endemic area within the past 6 months, nor has she had unprotected sex with a partner who has travelled to a [place] endemic region within the past 6 months. The patient has been advised to notify us if these factors change any time during this current pregnancy, so adequate testing and monitoring can be initiated.

## 2015-12-16 NOTE — Patient Instructions (Signed)
Pregnancy and Zika Virus Disease Zika virus disease, or Zika, is an illness that can spread to people from mosquitoes that carry the virus. It may also spread from person to person through infected body fluids. Zika first occurred in [place], but recently it has spread to new areas. The virus occurs in tropical climates. The location of Zika continues to change. Most people who become infected with Zika virus do not develop serious illness. However, Zika may cause birth defects in an unborn baby whose mother is infected with the virus. It may also increase the risk of miscarriage. WHAT ARE THE SYMPTOMS OF ZIKA VIRUS DISEASE? In many cases, people who have been infected with Zika virus do not develop any symptoms. If symptoms appear, they usually start about a week after the person is infected. Symptoms are usually mild. They may include:  Fever.  Rash.  Red eyes.  Joint pain. HOW DOES ZIKA VIRUS DISEASE SPREAD? The main way that Zika virus spreads is through the bite of a certain type of mosquito. Unlike most types of mosquitos, which bite only at night, the type of mosquito that carries Zika virus bites both at night and during the day. Zika virus can also spread through sexual contact, through a blood transfusion, and from a mother to her baby before or during birth. Once you have had Zika virus disease, it is unlikely that you will get it again. CAN I PASS ZIKA TO MY BABY DURING PREGNANCY? Yes, Zika can pass from a mother to her baby before or during birth. WHAT PROBLEMS CAN ZIKA CAUSE FOR MY BABY? A woman who is infected with Zika virus while pregnant is at risk of having her baby born with a condition in which the brain or head is smaller than expected (microcephaly). Babies who have microcephaly can have developmental delays, seizures, hearing problems, and vision problems. Having Zika virus disease during pregnancy can also increase the risk of miscarriage. HOW CAN ZIKA VIRUS DISEASE BE  PREVENTED? There is no vaccine to prevent Zika. The best way to prevent the disease is to avoid infected mosquitoes and avoid exposure to body fluids that can spread the virus. Avoid any possible exposure to Zika by taking the following precautions. For women and their sex partners:  Avoid traveling to high-risk areas. The locations where Zika is being reported change often. To identify high-risk areas, check the CDC travel website: www.cdc.gov/zika/geo/index.html  If you or your sex partner must travel to a high-risk area, talk with a health care provider before and after traveling.  Take all precautions to avoid mosquito bites if you live in, or travel to, any of the high-risk areas. Insect repellents are safe to use during pregnancy.  Ask your health care provider when it is safe to have sexual contact. For women:  If you are pregnant or trying to become pregnant, avoid sexual contact with persons who may have been exposed to Zika virus, persons who have possible symptoms of Zika, or persons whose history you are unsure about. If you choose to have sexual contact with someone who may have been exposed to Zika virus, use condoms correctly during the entire duration of sexual activity, every time. Do not share sexual devices, as you may be exposed to body fluids.  Ask your health care provider about when it is safe to attempt pregnancy after a possible exposure to Zika virus. WHAT STEPS SHOULD I TAKE TO AVOID MOSQUITO BITES? Take these steps to avoid mosquito bites when you are   in a high-risk area:  Wear loose clothing that covers your arms and legs.  Limit your outdoor activities.  Do not open windows unless they have window screens.  Sleep under mosquito nets.  Use insect repellent. The best insect repellents have:  DEET, picaridin, oil of lemon eucalyptus (OLE), or IR3535 in them.  Higher amounts of an active ingredient in them.  Remember that insect repellents are safe to use  during pregnancy.  Do not use OLE on children who are younger than 3 years of age. Do not use insect repellent on babies who are younger than 2 months of age.  Cover your child's stroller with mosquito netting. Make sure the netting fits snugly and that any loose netting does not cover your child's mouth or nose. Do not use a blanket as a mosquito-protection cover.  Do not apply insect repellent underneath clothing.  If you are using sunscreen, apply the sunscreen before applying the insect repellent.  Treat clothing with permethrin. Do not apply permethrin directly to your skin. Follow label directions for safe use.  Get rid of standing water, where mosquitoes may reproduce. Standing water is often found in items such as buckets, bowls, animal food dishes, and flowerpots. When you return from traveling to any high-risk area, continue taking actions to protect yourself against mosquito bites for 3 weeks, even if you show no signs of illness. This will prevent spreading Zika virus to uninfected mosquitoes. WHAT SHOULD I KNOW ABOUT THE SEXUAL TRANSMISSION OF ZIKA? People can spread Zika to their sexual partners during vaginal, anal, or oral sex, or by sharing sexual devices. Many people with Zika do not develop symptoms, so a person could spread the disease without knowing that they are infected. The greatest risk is to women who are pregnant or who may become pregnant. Zika virus can live longer in semen than it can live in blood. Couples can prevent sexual transmission of the virus by:  Using condoms correctly during the entire duration of sexual activity, every time. This includes vaginal, anal, and oral sex.  Not sharing sexual devices. Sharing increases your risk of being exposed to body fluid from another person.  Avoiding all sexual activity until your health care provider says it is safe. SHOULD I BE TESTED FOR ZIKA VIRUS? A sample of your blood can be tested for Zika virus. A pregnant  woman should be tested if she may have been exposed to the virus or if she has symptoms of Zika. She may also have additional tests done during her pregnancy, such ultrasound testing. Talk with your health care provider about which tests are recommended.   This information is not intended to replace advice given to you by your health care provider. Make sure you discuss any questions you have with your health care provider.   Document Released: 07/22/2015 Document Reviewed: 07/15/2015 Elsevier Interactive Patient Education 2016 Elsevier Inc. Minor Illnesses and Medications in Pregnancy  Cold/Flu:  Sudafed for congestion- Robitussin (plain) for cough- Tylenol for discomfort.  Please follow the directions on the label.  Try not to take any more than needed.  OTC Saline nasal spray and air humidifier or cool-mist  Vaporizer to sooth nasal irritation and to loosen congestion.  It is also important to increase intake of non carbonated fluids, especially if you have a fever.  Constipation:  Colace-2 capsules at bedtime; Metamucil- follow directions on label; Senokot- 1 tablet at bedtime.  Any one of these medications can be used.  It is also   very important to increase fluids and fruits along with regular exercise.  If problem persists please call the office.  Diarrhea:  Kaopectate as directed on the label.  Eat a bland diet and increase fluids.  Avoid highly seasoned foods.  Headache:  Tylenol 1 or 2 tablets every 3-4 hours as needed  Indigestion:  Maalox, Mylanta, Tums or Rolaids- as directed on label.  Also try to eat small meals and avoid fatty, greasy or spicy foods.  Nausea with or without Vomiting:  Nausea in pregnancy is caused by increased levels of hormones in the body which influence the digestive system and cause irritation when stomach acids accumulate.  Symptoms usually subside after 1st trimester of pregnancy.  Try the following:  Keep saltines, graham crackers or dry toast by your bed  to eat upon awakening.  Don't let your stomach get empty.  Try to eat 5-6 small meals per day instead of 3 large ones.  Avoid greasy fatty or highly seasoned foods.   Take OTC Unisom 1 tablet at bed time along with OTC Vitamin B6 25-50 mg 3 times per day.    If nausea continues with vomiting and you are unable to keep down food and fluids you may need a prescription medication.  Please notify your provider.   Sore throat:  Chloraseptic spray, throat lozenges and or plain Tylenol.  Vaginal Yeast Infection:  OTC Monistat for 7 days as directed on label.  If symptoms do not resolve within a week notify provider.  If any of the above problems do not subside with recommended treatment please call the office for further assistance.   Do not take Aspirin, Advil, Motrin or Ibuprofen.  * * OTC= Over the counter Hyperemesis Gravidarum Hyperemesis gravidarum is a severe form of nausea and vomiting that happens during pregnancy. Hyperemesis is worse than morning sickness. It may cause you to have nausea or vomiting all day for many days. It may keep you from eating and drinking enough food and liquids. Hyperemesis usually occurs during the first half (the first 20 weeks) of pregnancy. It often goes away once a woman is in her second half of pregnancy. However, sometimes hyperemesis continues through an entire pregnancy.  CAUSES  The cause of this condition is not completely known but is thought to be related to changes in the body's hormones when pregnant. It could be from the high level of the pregnancy hormone or an increase in estrogen in the body.  SIGNS AND SYMPTOMS   Severe nausea and vomiting.  Nausea that does not go away.  Vomiting that does not allow you to keep any food down.  Weight loss and body fluid loss (dehydration).  Having no desire to eat or not liking food you have previously enjoyed. DIAGNOSIS  Your health care provider will do a physical exam and ask you about your symptoms.  He or she may also order blood tests and urine tests to make sure something else is not causing the problem.  TREATMENT  You may only need medicine to control the problem. If medicines do not control the nausea and vomiting, you will be treated in the hospital to prevent dehydration, increased acid in the blood (acidosis), weight loss, and changes in the electrolytes in your body that may harm the unborn baby (fetus). You may need IV fluids.  HOME CARE INSTRUCTIONS   Only take over-the-counter or prescription medicines as directed by your health care provider.  Try eating a couple of dry crackers or   toast in the morning before getting out of bed.  Avoid foods and smells that upset your stomach.  Avoid fatty and spicy foods.  Eat 5-6 small meals a day.  Do not drink when eating meals. Drink between meals.  For snacks, eat high-protein foods, such as cheese.  Eat or suck on things that have ginger in them. Ginger helps nausea.  Avoid food preparation. The smell of food can spoil your appetite.  Avoid iron pills and iron in your multivitamins until after 3-4 months of being pregnant. However, consult with your health care provider before stopping any prescribed iron pills. SEEK MEDICAL CARE IF:   Your abdominal pain increases.  You have a severe headache.  You have vision problems.  You are losing weight. SEEK IMMEDIATE MEDICAL CARE IF:   You are unable to keep fluids down.  You vomit blood.  You have constant nausea and vomiting.  You have excessive weakness.  You have extreme thirst.  You have dizziness or fainting.  You have a fever or persistent symptoms for more than 2-3 days.  You have a fever and your symptoms suddenly get worse. MAKE SURE YOU:   Understand these instructions.  Will watch your condition.  Will get help right away if you are not doing well or get worse.   This information is not intended to replace advice given to you by your health care  provider. Make sure you discuss any questions you have with your health care provider.   Document Released: 10/31/2005 Document Revised: 08/21/2013 Document Reviewed: 06/12/2013 Elsevier Interactive Patient Education 2016 Elsevier Inc. Commonly Asked Questions During Pregnancy  Cats: A parasite can be excreted in cat feces.  To avoid exposure you need to have another person empty the little box.  If you must empty the litter box you will need to wear gloves.  Wash your hands after handling your cat.  This parasite can also be found in raw or undercooked meat so this should also be avoided.  Colds, Sore Throats, Flu: Please check your medication sheet to see what you can take for symptoms.  If your symptoms are unrelieved by these medications please call the office.  Dental Work: Most any dental work your dentist recommends is permitted.  X-rays should only be taken during the first trimester if absolutely necessary.  Your abdomen should be shielded with a lead apron during all x-rays.  Please notify your provider prior to receiving any x-rays.  Novocaine is fine; gas is not recommended.  If your dentist requires a note from us prior to dental work please call the office and we will provide one for you.  Exercise: Exercise is an important part of staying healthy during your pregnancy.  You may continue most exercises you were accustomed to prior to pregnancy.  Later in your pregnancy you will most likely notice you have difficulty with activities requiring balance like riding a bicycle.  It is important that you listen to your body and avoid activities that put you at a higher risk of falling.  Adequate rest and staying well hydrated are a must!  If you have questions about the safety of specific activities ask your provider.    Exposure to Children with illness: Try to avoid obvious exposure; report any symptoms to us when noted,  If you have chicken pos, red measles or mumps, you should be immune to  these diseases.   Please do not take any vaccines while pregnant unless you have checked with   your OB provider.  Fetal Movement: After 28 weeks we recommend you do "kick counts" twice daily.  Lie or sit down in a calm quiet environment and count your baby movements "kicks".  You should feel your baby at least 10 times per hour.  If you have not felt 10 kicks within the first hour get up, walk around and have something sweet to eat or drink then repeat for an additional hour.  If count remains less than 10 per hour notify your provider.  Fumigating: Follow your pest control agent's advice as to how long to stay out of your home.  Ventilate the area well before re-entering.  Hemorrhoids:   Most over-the-counter preparations can be used during pregnancy.  Check your medication to see what is safe to use.  It is important to use a stool softener or fiber in your diet and to drink lots of liquids.  If hemorrhoids seem to be getting worse please call the office.   Hot Tubs:  Hot tubs Jacuzzis and saunas are not recommended while pregnant.  These increase your internal body temperature and should be avoided.  Intercourse:  Sexual intercourse is safe during pregnancy as long as you are comfortable, unless otherwise advised by your provider.  Spotting may occur after intercourse; report any bright red bleeding that is heavier than spotting.  Labor:  If you know that you are in labor, please go to the hospital.  If you are unsure, please call the office and let us help you decide what to do.  Lifting, straining, etc:  If your job requires heavy lifting or straining please check with your provider for any limitations.  Generally, you should not lift items heavier than that you can lift simply with your hands and arms (no back muscles)  Painting:  Paint fumes do not harm your pregnancy, but may make you ill and should be avoided if possible.  Latex or water based paints have less odor than oils.  Use adequate  ventilation while painting.  Permanents & Hair Color:  Chemicals in hair dyes are not recommended as they cause increase hair dryness which can increase hair loss during pregnancy.  " Highlighting" and permanents are allowed.  Dye may be absorbed differently and permanents may not hold as well during pregnancy.  Sunbathing:  Use a sunscreen, as skin burns easily during pregnancy.  Drink plenty of fluids; avoid over heating.  Tanning Beds:  Because their possible side effects are still unknown, tanning beds are not recommended.  Ultrasound Scans:  Routine ultrasounds are performed at approximately 20 weeks.  You will be able to see your baby's general anatomy an if you would like to know the gender this can usually be determined as well.  If it is questionable when you conceived you may also receive an ultrasound early in your pregnancy for dating purposes.  Otherwise ultrasound exams are not routinely performed unless there is a medical necessity.  Although you can request a scan we ask that you pay for it when conducted because insurance does not cover " patient request" scans.  Work: If your pregnancy proceeds without complications you may work until your due date, unless your physician or employer advises otherwise.  Round Ligament Pain/Pelvic Discomfort:  Sharp, shooting pains not associated with bleeding are fairly common, usually occurring in the second trimester of pregnancy.  They tend to be worse when standing up or when you remain standing for long periods of time.  These are the result   of pressure of certain pelvic ligaments called "round ligaments".  Rest, Tylenol and heat seem to be the most effective relief.  As the womb and fetus grow, they rise out of the pelvis and the discomfort improves.  Please notify the office if your pain seems different than that described.  It may represent a more serious condition.   

## 2015-12-17 ENCOUNTER — Other Ambulatory Visit: Payer: Self-pay | Admitting: Obstetrics and Gynecology

## 2015-12-17 DIAGNOSIS — O9989 Other specified diseases and conditions complicating pregnancy, childbirth and the puerperium: Principal | ICD-10-CM

## 2015-12-17 DIAGNOSIS — O09899 Supervision of other high risk pregnancies, unspecified trimester: Secondary | ICD-10-CM | POA: Insufficient documentation

## 2015-12-17 DIAGNOSIS — Z283 Underimmunization status: Secondary | ICD-10-CM | POA: Insufficient documentation

## 2015-12-17 LAB — CBC WITH DIFFERENTIAL/PLATELET
BASOS: 0 %
Basophils Absolute: 0 10*3/uL (ref 0.0–0.2)
EOS (ABSOLUTE): 0 10*3/uL (ref 0.0–0.4)
EOS: 1 %
HEMATOCRIT: 40.3 % (ref 34.0–46.6)
Hemoglobin: 13.6 g/dL (ref 11.1–15.9)
Immature Grans (Abs): 0 10*3/uL (ref 0.0–0.1)
Immature Granulocytes: 0 %
LYMPHS ABS: 1.8 10*3/uL (ref 0.7–3.1)
Lymphs: 28 %
MCH: 30.2 pg (ref 26.6–33.0)
MCHC: 33.7 g/dL (ref 31.5–35.7)
MCV: 89 fL (ref 79–97)
MONOS ABS: 0.3 10*3/uL (ref 0.1–0.9)
Monocytes: 4 %
NEUTROS ABS: 4.2 10*3/uL (ref 1.4–7.0)
Neutrophils: 67 %
Platelets: 201 10*3/uL (ref 150–379)
RBC: 4.51 x10E6/uL (ref 3.77–5.28)
RDW: 12.2 % — AB (ref 12.3–15.4)
WBC: 6.3 10*3/uL (ref 3.4–10.8)

## 2015-12-17 LAB — RPR: RPR Ser Ql: NONREACTIVE

## 2015-12-17 LAB — PAIN MGT SCRN (14 DRUGS), UR
Amphetamine Screen, Ur: NEGATIVE ng/mL
BARBITURATE SCRN UR: NEGATIVE ng/mL
BUPRENORPHINE, URINE: NEGATIVE ng/mL
Benzodiazepine Screen, Urine: NEGATIVE ng/mL
COCAINE(METAB.) SCREEN, URINE: NEGATIVE ng/mL
Cannabinoids Ur Ql Scn: NEGATIVE ng/mL
Creatinine(Crt), U: 250.2 mg/dL (ref 20.0–300.0)
Fentanyl, Urine: NEGATIVE pg/mL
MEPERIDINE SCREEN, URINE: NEGATIVE ng/mL
METHADONE SCREEN, URINE: NEGATIVE ng/mL
OPIATE SCRN UR: NEGATIVE ng/mL
Oxycodone+Oxymorphone Ur Ql Scn: NEGATIVE ng/mL
PCP Scrn, Ur: NEGATIVE ng/mL
PROPOXYPHENE SCREEN: NEGATIVE ng/mL
Ph of Urine: 5.8 (ref 4.5–8.9)
Tramadol Ur Ql Scn: NEGATIVE ng/mL

## 2015-12-17 LAB — RUBELLA ANTIBODY, IGM: Rubella IgM: <20 AU/mL (ref 0.0–19.9)

## 2015-12-17 LAB — URINALYSIS, ROUTINE W REFLEX MICROSCOPIC
BILIRUBIN UA: NEGATIVE
Glucose, UA: NEGATIVE
KETONES UA: NEGATIVE
Nitrite, UA: NEGATIVE
RBC UA: NEGATIVE
SPEC GRAV UA: 1.026 (ref 1.005–1.030)
UUROB: 1 mg/dL (ref 0.2–1.0)
pH, UA: 6 (ref 5.0–7.5)

## 2015-12-17 LAB — MICROSCOPIC EXAMINATION: Casts: NONE SEEN /lpf

## 2015-12-17 LAB — SICKLE CELL SCREEN: SICKLE CELL SCREEN: NEGATIVE

## 2015-12-17 LAB — NICOTINE SCREEN, URINE: COTININE UR QL SCN: NEGATIVE ng/mL

## 2015-12-17 LAB — ANTIBODY SCREEN: Antibody Screen: NEGATIVE

## 2015-12-17 LAB — VARICELLA ZOSTER ANTIBODY, IGM: VARICELLA IGM: 1.42 {index} — AB (ref 0.00–0.90)

## 2015-12-17 LAB — RH TYPE: Rh Factor: POSITIVE

## 2015-12-17 LAB — HEPATITIS B SURFACE ANTIGEN: Hepatitis B Surface Ag: NEGATIVE

## 2015-12-17 LAB — HIV ANTIBODY (ROUTINE TESTING W REFLEX): HIV Screen 4th Generation wRfx: NONREACTIVE

## 2015-12-17 LAB — ABO

## 2015-12-18 ENCOUNTER — Other Ambulatory Visit: Payer: Managed Care, Other (non HMO)

## 2015-12-18 ENCOUNTER — Other Ambulatory Visit: Payer: Self-pay | Admitting: Obstetrics and Gynecology

## 2015-12-18 DIAGNOSIS — N926 Irregular menstruation, unspecified: Secondary | ICD-10-CM

## 2015-12-18 DIAGNOSIS — Z3491 Encounter for supervision of normal pregnancy, unspecified, first trimester: Secondary | ICD-10-CM

## 2015-12-18 LAB — GC/CHLAMYDIA PROBE AMP
Chlamydia trachomatis, NAA: NEGATIVE
NEISSERIA GONORRHOEAE BY PCR: NEGATIVE

## 2015-12-18 MED ORDER — NITROFURANTOIN MONOHYD MACRO 100 MG PO CAPS: 100.0000 mg | ORAL_CAPSULE | Freq: Two times a day (BID) | ORAL | Status: DC

## 2015-12-19 ENCOUNTER — Other Ambulatory Visit: Payer: Self-pay | Admitting: Obstetrics and Gynecology

## 2015-12-19 DIAGNOSIS — R8271 Bacteriuria: Secondary | ICD-10-CM | POA: Insufficient documentation

## 2015-12-19 LAB — BETA HCG QUANT (REF LAB): HCG QUANT: 934 m[IU]/mL

## 2015-12-21 ENCOUNTER — Telehealth: Payer: Self-pay | Admitting: *Deleted

## 2015-12-21 LAB — URINE CULTURE, OB REFLEX

## 2015-12-21 LAB — CULTURE, OB URINE

## 2015-12-21 NOTE — Telephone Encounter (Signed)
Unable to reach pt by phone, mailed letter to contact our office

## 2015-12-21 NOTE — Telephone Encounter (Signed)
-----   Message from Purcell Nails, [place] sent at 12/18/2015 12:27 PM EST ----- Please let her know she has a UTI- I sent in an antibiotic that she needs to start immediately

## 2015-12-22 ENCOUNTER — Other Ambulatory Visit: Payer: Self-pay

## 2015-12-22 DIAGNOSIS — Z3490 Encounter for supervision of normal pregnancy, unspecified, unspecified trimester: Secondary | ICD-10-CM

## 2015-12-22 DIAGNOSIS — Z3687 Encounter for antenatal screening for uncertain dates: Secondary | ICD-10-CM

## 2015-12-25 ENCOUNTER — Other Ambulatory Visit: Payer: Self-pay | Admitting: Obstetrics and Gynecology

## 2015-12-25 ENCOUNTER — Other Ambulatory Visit: Payer: Managed Care, Other (non HMO)

## 2015-12-25 DIAGNOSIS — Z3687 Encounter for antenatal screening for uncertain dates: Secondary | ICD-10-CM

## 2015-12-25 DIAGNOSIS — Z3490 Encounter for supervision of normal pregnancy, unspecified, unspecified trimester: Secondary | ICD-10-CM

## 2015-12-26 LAB — BETA HCG QUANT (REF LAB): HCG QUANT: 7517 m[IU]/mL

## 2015-12-29 ENCOUNTER — Telehealth: Payer: Self-pay | Admitting: *Deleted

## 2015-12-29 NOTE — Telephone Encounter (Signed)
Notified pt of results, she states she doesn't have another appt, she states she was told she would need repeat US for dating, is this ok???

## 2015-12-29 NOTE — Telephone Encounter (Signed)
-----   Message from Purcell Nails, [place] sent at 12/29/2015  3:38 PM EST ----- Please let her know levels went up appropriately

## 2015-12-29 NOTE — Telephone Encounter (Signed)
Yes, please scheulde next week

## 2015-12-30 NOTE — Telephone Encounter (Signed)
PT COMING IN Friday AT 11:30

## 2015-12-30 NOTE — Telephone Encounter (Signed)
Julie Lawson would you please schedule and call pt, thanks so much

## 2016-01-01 ENCOUNTER — Ambulatory Visit (INDEPENDENT_AMBULATORY_CARE_PROVIDER_SITE_OTHER): Payer: Managed Care, Other (non HMO)

## 2016-01-01 ENCOUNTER — Other Ambulatory Visit: Payer: Self-pay | Admitting: Obstetrics and Gynecology

## 2016-01-01 DIAGNOSIS — O3680X0 Pregnancy with inconclusive fetal viability, not applicable or unspecified: Secondary | ICD-10-CM | POA: Diagnosis not present

## 2016-02-09 ENCOUNTER — Encounter: Payer: Self-pay | Admitting: Obstetrics and Gynecology

## 2016-02-09 ENCOUNTER — Ambulatory Visit (INDEPENDENT_AMBULATORY_CARE_PROVIDER_SITE_OTHER): Payer: Managed Care, Other (non HMO) | Admitting: Obstetrics and Gynecology

## 2016-02-09 VITALS — BP 100/65 | HR 81 | Wt 107.7 lb

## 2016-02-09 DIAGNOSIS — Z124 Encounter for screening for malignant neoplasm of cervix: Secondary | ICD-10-CM

## 2016-02-09 DIAGNOSIS — O9989 Other specified diseases and conditions complicating pregnancy, childbirth and the puerperium: Secondary | ICD-10-CM

## 2016-02-09 DIAGNOSIS — O99611 Diseases of the digestive system complicating pregnancy, first trimester: Secondary | ICD-10-CM

## 2016-02-09 DIAGNOSIS — Z3492 Encounter for supervision of normal pregnancy, unspecified, second trimester: Secondary | ICD-10-CM

## 2016-02-09 DIAGNOSIS — Z283 Underimmunization status: Secondary | ICD-10-CM

## 2016-02-09 DIAGNOSIS — Z2839 Other underimmunization status: Secondary | ICD-10-CM

## 2016-02-09 DIAGNOSIS — K5900 Constipation, unspecified: Secondary | ICD-10-CM

## 2016-02-09 DIAGNOSIS — R8271 Bacteriuria: Secondary | ICD-10-CM

## 2016-02-09 LAB — POCT URINALYSIS DIPSTICK
Bilirubin, UA: NEGATIVE
Blood, UA: NEGATIVE
Glucose, UA: NEGATIVE
KETONES UA: NEGATIVE
Nitrite, UA: NEGATIVE
PROTEIN UA: NEGATIVE
Spec Grav, UA: <=1.005
Urobilinogen, UA: 4
pH, UA: 8.5

## 2016-02-09 MED ORDER — NITROFURANTOIN MONOHYD MACRO 100 MG PO CAPS: 100.0000 mg | ORAL_CAPSULE | Freq: Two times a day (BID) | ORAL | Status: DC

## 2016-02-09 NOTE — Patient Instructions (Signed)

## 2016-02-09 NOTE — Progress Notes (Signed)
OBSTETRIC INITIAL PRENATAL VISIT  Subjective:    Julie Lawson is being seen today for her first obstetrical visit.  This is a planned pregnancy. She is a 25 y.o. 253P2002 female at 5811w0d gestation, Estimated Date of Delivery: 08/23/16 inconsistent with last menstrual period 10/16/2015 (dated by 6 week sono). Her obstetrical history is significant for none. Relationship with FOB: spouse, living together. Patient does not intend to breast feed. Pregnancy history fully reviewed.   Obstetric History   G3   P2   T2   P0   A0   TAB0   SAB0   E0   M0   L2     # Outcome Date GA Lbr Len/2nd Weight Sex Delivery Anes PTL Lv  3 Current           2 Term 04/21/14 8869w0d  6 lb 1 oz (2.75 kg) F Vag-Spont  N Y  1 Term 11/22/10 4169w0d  6 lb 4 oz (2.835 kg) M Vag-Spont  N Y      Gynecologic History:  Patient does not have a pap history.   Denies history of STIs.    Past Medical History  Diagnosis Date  . Asthma     no history of exacerbations since childhood.    Family History  Problem Relation Age of Onset  . Arthritis Other     surgery  . Diabetes Maternal Grandmother   . Heart failure Maternal Grandfather   . Migraines Mother     History reviewed. No pertinent past surgical history.   Social History   Social History  . Marital Status: Married    Spouse Name: N/A  . Number of Children: N/A  . Years of Education: N/A   Occupational History  . machine operator    Social History Main Topics  . Smoking status: Never Smoker   . Smokeless tobacco: Never Used  . Alcohol Use: No  . Drug Use: No  . Sexual Activity:    Partners: Male    Birth Control/ Protection: None     Comment: Pregnant    Other Topics Concern  . Not on file   Social History Narrative    Current Outpatient Prescriptions on File Prior to Visit  Medication Sig Dispense Refill  . albuterol (PROVENTIL HFA;VENTOLIN HFA) 108 (90 BASE) MCG/ACT inhaler Inhale 2 puffs into the lungs every 6 (six) hours as  needed. For shortness of breath    . docusate sodium (COLACE) 100 MG capsule Take 1 capsule (100 mg total) by mouth 2 (two) times daily. 60 capsule 1  . EPINEPHrine (EPIPEN 2-PAK) 0.3 mg/0.3 mL IJ SOAJ injection Inject 0.3 mg into the muscle once.    . hydrocortisone (ANUSOL-HC) 25 MG suppository Place 1 suppository (25 mg total) rectally every 12 (twelve) hours. 12 suppository 1  . Prenat-FeFum-FePo-FA-Omega 3 (CONCEPT DHA) 53.5-38-1 MG CAPS Take 1 tablet by mouth daily. 30 capsule 11   No current facility-administered medications on file prior to visit.    Allergies  Allergen Reactions  . Amoxicillin Hives  . Bee Venom     Has EPIPEN    Review of Systems General:Not Present- Fever, Weight Loss and Weight Gain. Skin:Not Present- Rash. HEENT:Not Present- Blurred Vision, Headache and Bleeding Gums. Respiratory:Not Present- Difficulty Breathing. Breast:Not Present- Breast Mass. Cardiovascular:Not Present- Chest Pain, Elevated Blood Pressure, Fainting / Blacking Out and Shortness of Breath. Gastrointestinal:Present - Constipation (improving with meds). Not Present- Abdominal Pain, Nausea and Vomiting. Female Genitourinary:Not Present- Frequency, Painful Urination, Pelvic  Pain, Vaginal Bleeding, Vaginal Discharge, Contractions, regular, Fetal Movements Decreased, Urinary Complaints and Vaginal Fluid. Musculoskeletal:Not Present- Back Pain and Leg Cramps. Neurological:Not Present- Dizziness. Psychiatric:Not Present- Depression.     Objective:   Blood pressure 100/65, pulse 81, weight 107 lb 11.2 oz (48.852 kg), last menstrual period 10/16/2015.  Body mass index is 20.36 kg/(m^2).  General Appearance:    Alert, cooperative, no distress, appears stated age  Head:    Normocephalic, without obvious abnormality, atraumatic  Eyes:    PERRL, conjunctiva/corneas clear, EOM's intact, both eyes  Ears:    Normal external ear canals, both ears  Nose:   Nares normal, septum midline,  mucosa normal, no drainage or sinus tenderness  Throat:   Lips, mucosa, and tongue normal; teeth and gums normal  Neck:   Supple, symmetrical, trachea midline, no adenopathy; thyroid: no enlargement/tenderness/nodules; no carotid bruit or JVD  Back:     Symmetric, no curvature, ROM normal, no CVA tenderness  Lungs:     Clear to auscultation bilaterally, respirations unlabored  Chest Wall:    No tenderness or deformity   Heart:    Regular rate and rhythm, S1 and S2 normal, no murmur, rub or gallop  Breast Exam:    No tenderness, masses, or nipple abnormality  Abdomen:     Soft, non-tender, bowel sounds active all four quadrants, no masses, no organomegaly.   FHT 155 bpm.  Genitalia:    Pelvic:external genitalia normal, vagina without lesions, discharge, or tenderness, rectovaginal septum  normal. Cervix normal in appearance, no cervical motion tenderness, no adnexal masses or tenderness.  Pregnancy positive findings: uterine enlargement: 12 wk size, nontender.   Rectal:    Normal external sphincter.  No hemorrhoids appreciated. Internal exam not done.   Extremities:   Extremities normal, atraumatic, no cyanosis or edema  Pulses:   2+ and symmetric all extremities  Skin:   Skin color, texture, turgor normal, no rashes or lesions  Lymph nodes:   Cervical, supraclavicular, and axillary nodes normal  Neurologic:   CNII-XII intact, normal strength, sensation and reflexes throughout      Assessment:    Pregnancy at 12 and 0/7 weeks   Constipation in pregnancy Plan:    Initial labs reviewed. Prenatal vitamins encouraged. Problem list reviewed and updated. Rubella non-immune, will need vaccination postpartum.  New OB counseling:  The patient has been given an overview regarding routine prenatal care.  Recommendations regarding diet, weight gain, and exercise in pregnancy were given. Prenatal testing, optional genetic testing, and ultrasound use in pregnancy were reviewed.  AFP3 discussed:  declined. Benefits of Breast Feeding were discussed. The patient is encouraged to consider nursing her baby post partum. Constipation in pregnancy treated with Colace.  Follow up in 4 weeks.  50% of 30 min visit spent on counseling and coordination of care.     Hildred Laser, MD [facility]

## 2016-02-13 LAB — PAP IG, CT-NG, RFX HPV ASCU
CHLAMYDIA, NUC. ACID AMP: NEGATIVE
GONOCOCCUS BY NUCLEIC ACID AMP: NEGATIVE
PAP SMEAR COMMENT: 0

## 2016-02-15 ENCOUNTER — Encounter: Payer: Self-pay | Admitting: Obstetrics and Gynecology

## 2016-02-15 DIAGNOSIS — K5900 Constipation, unspecified: Secondary | ICD-10-CM | POA: Insufficient documentation

## 2016-02-15 DIAGNOSIS — O99611 Diseases of the digestive system complicating pregnancy, first trimester: Secondary | ICD-10-CM

## 2016-02-16 ENCOUNTER — Telehealth: Payer: Self-pay

## 2016-02-16 NOTE — Telephone Encounter (Signed)
Called pt informed her of information below.  

## 2016-02-16 NOTE — Telephone Encounter (Signed)
-----   Message from Hildred LaserAnika Cherry, MD sent at 02/15/2016  8:27 PM EDT ----- Patient with yeast infection on pap smear.  Can treat with OTC Monistat 3 day or 7 day treatment.

## 2016-03-08 ENCOUNTER — Ambulatory Visit (INDEPENDENT_AMBULATORY_CARE_PROVIDER_SITE_OTHER): Payer: Managed Care, Other (non HMO) | Admitting: Obstetrics and Gynecology

## 2016-03-08 VITALS — BP 91/52 | HR 92 | Wt 108.6 lb

## 2016-03-08 DIAGNOSIS — Z3492 Encounter for supervision of normal pregnancy, unspecified, second trimester: Secondary | ICD-10-CM

## 2016-03-08 DIAGNOSIS — Z3482 Encounter for supervision of other normal pregnancy, second trimester: Secondary | ICD-10-CM

## 2016-03-08 DIAGNOSIS — R8271 Bacteriuria: Secondary | ICD-10-CM

## 2016-03-08 LAB — POCT URINALYSIS DIPSTICK
Bilirubin, UA: NEGATIVE
GLUCOSE UA: NEGATIVE
KETONES UA: NEGATIVE
Nitrite, UA: NEGATIVE
Protein, UA: NEGATIVE
RBC UA: NEGATIVE
SPEC GRAV UA: 1.01
UROBILINOGEN UA: NEGATIVE
pH, UA: 7.5

## 2016-03-08 NOTE — Progress Notes (Signed)
ROB: Patient doing well, denies complaints.  For f/u in 4 weeks, will need anatomy scan at that time.  Will repeat urine culture as last culture with mild GBS bacteruria and mixed flora.

## 2016-03-10 LAB — URINE CULTURE

## 2016-04-12 ENCOUNTER — Ambulatory Visit (INDEPENDENT_AMBULATORY_CARE_PROVIDER_SITE_OTHER): Payer: Managed Care, Other (non HMO) | Admitting: Obstetrics and Gynecology

## 2016-04-12 ENCOUNTER — Ambulatory Visit (INDEPENDENT_AMBULATORY_CARE_PROVIDER_SITE_OTHER): Payer: Managed Care, Other (non HMO)

## 2016-04-12 ENCOUNTER — Encounter: Payer: Self-pay | Admitting: Obstetrics and Gynecology

## 2016-04-12 VITALS — BP 86/47 | HR 80 | Wt 114.6 lb

## 2016-04-12 DIAGNOSIS — Z3492 Encounter for supervision of normal pregnancy, unspecified, second trimester: Secondary | ICD-10-CM

## 2016-04-12 DIAGNOSIS — Z3482 Encounter for supervision of other normal pregnancy, second trimester: Secondary | ICD-10-CM | POA: Diagnosis not present

## 2016-04-12 DIAGNOSIS — O2612 Low weight gain in pregnancy, second trimester: Secondary | ICD-10-CM

## 2016-04-12 DIAGNOSIS — O2610 Low weight gain in pregnancy, unspecified trimester: Secondary | ICD-10-CM | POA: Insufficient documentation

## 2016-04-12 LAB — POCT URINALYSIS DIPSTICK
Bilirubin, UA: NEGATIVE
Glucose, UA: NEGATIVE
Ketones, UA: NEGATIVE
Nitrite, UA: NEGATIVE
PH UA: 7.5
Protein, UA: NEGATIVE
RBC UA: NEGATIVE
Spec Grav, UA: 1.01
UROBILINOGEN UA: 1

## 2016-04-12 MED ORDER — ENSURE HEALTHY MOM PO LIQD: 1.0000 | Freq: Two times a day (BID) | ORAL | Status: AC

## 2016-04-12 NOTE — Progress Notes (Signed)
ROB: Patient still notes occasional headaches.  Takes Tylenol for relief.  S/p normal anatomy scan today. [facility] recommends nutritional supplement of Boost/Ensure for poor weight gain.  Will prescribe for BID use. To reassess weight gain at next visit.  RTC in 4 weeks.

## 2016-05-10 ENCOUNTER — Ambulatory Visit (INDEPENDENT_AMBULATORY_CARE_PROVIDER_SITE_OTHER): Payer: Managed Care, Other (non HMO) | Admitting: Obstetrics and Gynecology

## 2016-05-10 VITALS — BP 86/49 | HR 92 | Wt 114.3 lb

## 2016-05-10 DIAGNOSIS — O2612 Low weight gain in pregnancy, second trimester: Secondary | ICD-10-CM

## 2016-05-10 DIAGNOSIS — Z3492 Encounter for supervision of normal pregnancy, unspecified, second trimester: Secondary | ICD-10-CM

## 2016-05-10 DIAGNOSIS — Z3482 Encounter for supervision of other normal pregnancy, second trimester: Secondary | ICD-10-CM

## 2016-05-10 DIAGNOSIS — Z131 Encounter for screening for diabetes mellitus: Secondary | ICD-10-CM

## 2016-05-10 DIAGNOSIS — O26812 Pregnancy related exhaustion and fatigue, second trimester: Secondary | ICD-10-CM

## 2016-05-10 LAB — POCT URINALYSIS DIPSTICK
BILIRUBIN UA: NEGATIVE
Glucose, UA: NEGATIVE
KETONES UA: NEGATIVE
NITRITE UA: NEGATIVE
PH UA: 7
Protein, UA: NEGATIVE
RBC UA: NEGATIVE
SPEC GRAV UA: 1.01
Urobilinogen, UA: NEGATIVE

## 2016-05-10 NOTE — Progress Notes (Signed)
ROB: Patient doing well, denies complaints except fatigue.  Is only partially compliant with Boost supplementation (notes she doesn't like the taste).  Advised on protein bars and snacks. RTC in 4 weeks. For 28 week labs at that time. Would like to discuss BTL next visit.

## 2016-06-09 ENCOUNTER — Other Ambulatory Visit: Payer: Managed Care, Other (non HMO)

## 2016-06-09 ENCOUNTER — Encounter: Payer: Managed Care, Other (non HMO) | Admitting: Obstetrics and Gynecology

## 2016-06-24 ENCOUNTER — Observation Stay
Admission: EM | Admit: 2016-06-24 | Discharge: 2016-06-25 | Discharge status: Against Medical Advice | Payer: Managed Care, Other (non HMO) | Attending: Obstetrics and Gynecology | Admitting: Obstetrics and Gynecology

## 2016-06-24 DIAGNOSIS — Z3490 Encounter for supervision of normal pregnancy, unspecified, unspecified trimester: Principal | ICD-10-CM | POA: Insufficient documentation

## 2016-06-24 DIAGNOSIS — O09899 Supervision of other high risk pregnancies, unspecified trimester: Secondary | ICD-10-CM

## 2016-06-24 DIAGNOSIS — R8271 Bacteriuria: Secondary | ICD-10-CM

## 2016-06-24 DIAGNOSIS — O9989 Other specified diseases and conditions complicating pregnancy, childbirth and the puerperium: Secondary | ICD-10-CM

## 2016-06-24 DIAGNOSIS — Z283 Underimmunization status: Secondary | ICD-10-CM

## 2016-06-24 DIAGNOSIS — Z5321 Procedure and treatment not carried out due to patient leaving prior to being seen by health care provider: Secondary | ICD-10-CM | POA: Insufficient documentation

## 2016-06-25 DIAGNOSIS — Z3490 Encounter for supervision of normal pregnancy, unspecified, unspecified trimester: Secondary | ICD-10-CM | POA: Diagnosis not present

## 2016-06-25 DIAGNOSIS — Z5321 Procedure and treatment not carried out due to patient leaving prior to being seen by health care provider: Secondary | ICD-10-CM | POA: Diagnosis not present

## 2016-06-25 NOTE — OB Triage Note (Addendum)
Went to go check on pt @ at 0028and see why FHR monitor was not tracing. Room was found empty with patient and all belongings gone. Pt had not expressed any desire to leave or made any suggestions that they may leave AMA. Dr. Valentino Saxonherry notified.

## 2016-07-20 ENCOUNTER — Emergency Department
Admission: EM | Admit: 2016-07-20 | Discharge: 2016-07-20 | Disposition: A | Discharge status: Home / Self Care | Payer: Managed Care, Other (non HMO) | Attending: Emergency Medicine | Admitting: Emergency Medicine

## 2016-07-20 DIAGNOSIS — Z3A35 35 weeks gestation of pregnancy: Secondary | ICD-10-CM | POA: Insufficient documentation

## 2016-07-20 DIAGNOSIS — O99513 Diseases of the respiratory system complicating pregnancy, third trimester: Secondary | ICD-10-CM | POA: Diagnosis not present

## 2016-07-20 DIAGNOSIS — Z79899 Other long term (current) drug therapy: Secondary | ICD-10-CM | POA: Diagnosis not present

## 2016-07-20 DIAGNOSIS — J45909 Unspecified asthma, uncomplicated: Secondary | ICD-10-CM | POA: Insufficient documentation

## 2016-07-20 DIAGNOSIS — J0100 Acute maxillary sinusitis, unspecified: Secondary | ICD-10-CM | POA: Diagnosis not present

## 2016-07-20 MED ORDER — ALBUTEROL SULFATE HFA 108 (90 BASE) MCG/ACT IN AERS: 1.0000 | INHALATION_SPRAY | Freq: Four times a day (QID) | RESPIRATORY_TRACT | 0 refills | Status: AC | PRN

## 2016-07-20 MED ORDER — AZITHROMYCIN 250 MG PO TABS: ORAL_TABLET | ORAL | 0 refills | Status: DC

## 2016-07-20 MED ORDER — FLUTICASONE PROPIONATE 50 MCG/ACT NA SUSP: 2.0000 | Freq: Every day | NASAL | 0 refills | Status: AC

## 2016-07-20 NOTE — ED Provider Notes (Signed)
[facility] Emergency Department Provider Note  ____________________________________________  Time seen: Approximately 1:45 PM  I have reviewed the triage vital signs and the nursing notes.   HISTORY  Chief Complaint URI    HPI Julie Lawson is a 25 y.o. female, [redacted] weeks pregnant, NAD, presents to the emergency department with five-day history of headache, cough, nasal drainage and sore throat. Patient has constant productive cough and nasal drainage. Ears have been popping, but no change in hearing, drainage or pain has been noted. Denies fevers, chills, night sweats, body aches, abdominal pain, change in bowel or bladder habits, vaginal bleeding or discharge, or dizziness. History of asthma, but has had no exacerbations since childhood and does not know where albuterol rescue inhaler is located. No known sick contacts, but son recently started kindergarten. Has been taking sudafed and tylenol as instructed by her OBGYN with no relief of symptoms.   Past Medical History:  Diagnosis Date  . Asthma    no history of exacerbations since childhood.    Patient Active Problem List   Diagnosis Date Noted  . Labor and delivery, indication for care 06/25/2016  . Poor weight gain of pregnancy 04/12/2016  . Supervision of normal pregnancy in second trimester 04/12/2016  . Constipation in pregnancy in first trimester 02/15/2016  . GBS bacteriuria 12/19/2015  . Rubella non-immune status, antepartum 12/17/2015  . Hemorrhoid 04/14/2014  . 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 No pertinent surgical history.  Prior to Admission medications   Medication Sig Start Date End Date Taking? Authorizing Provider  albuterol (PROVENTIL HFA;VENTOLIN HFA) 108 (90 Base) MCG/ACT inhaler Inhale 1-2 puffs into the lungs every 6 (six) hours as needed for wheezing or shortness of breath. 07/20/16   Safwan Tomei L Anastacia Reinecke, PA-C  azithromycin (ZITHROMAX Z-PAK) 250 MG tablet Take 2  tablets (500 mg) on  Day 1,  followed by 1 tablet (250 mg) once daily on Days 2 through 5. 07/20/16   Hilding Quintanar L Nychelle Cassata, PA-C  docusate sodium (COLACE) 100 MG capsule Take 1 capsule (100 mg total) by mouth 2 (two) times daily. Patient not taking: Reported on 03/08/2016 12/16/15   Melody N Shambley, CNM  EPINEPHrine (EPIPEN 2-PAK) 0.3 mg/0.3 mL IJ SOAJ injection Inject 0.3 mg into the muscle once.    Historical Provider, MD  fluticasone (FLONASE) 50 MCG/ACT nasal spray Place 2 sprays into both nostrils daily. 07/20/16   Ioan Landini L Aldo Sondgeroth, PA-C  hydrocortisone (ANUSOL-HC) 25 MG suppository Place 1 suppository (25 mg total) rectally every 12 (twelve) hours. Patient not taking: Reported on 04/12/2016 12/12/15 12/11/16  Charlesetta Ivory Menshew, PA-C  Nutritional Supplements (ENSURE HEALTHY MOM) LIQD Take 1 Bottle by mouth 2 (two) times daily at 8 am and 10 pm. Patient not taking: Reported on 06/24/2016 04/12/16   Hildred Laser, MD  Prenat-FeFum-FePo-FA-Omega 3 (CONCEPT DHA) 53.5-38-1 MG CAPS Take 1 tablet by mouth daily. Patient not taking: Reported on 06/24/2016 12/16/15   Melody N Shambley, CNM    Allergies Amoxicillin and Bee venom  Family History  Problem Relation Age of Onset  . Migraines Mother   . Diabetes Maternal Grandmother   . Heart failure Maternal Grandfather   . Arthritis Other     surgery    Social History Social History  Substance Use Topics  . Smoking status: Never Smoker  . Smokeless tobacco: Never Used  . Alcohol use No     Review of Systems  Constitutional: No fever/chills Eyes: No visual changes. No  discharge, Redness, pain ENT: Positive sore throat, sinus congestion, sinus pressure, yellow nasal discharge, nasal congestion, ear congestion. No ear drainage, sneezing Cardiovascular: No chest pain, palpitations. Respiratory: Positive cough productive of yellow sputum, chest congestion. No wheezing, shortness of breath.  Gastrointestinal: No abdominal pain.  No nausea, vomiting.    Genitourinary: Negative for dysuria, vaginal discharge, pelvic pain.  Musculoskeletal: Negative for general myalgias.  Skin: Negative for rash. Neurological: Positive for headache but no focal weakness or numbness. + headache 10-point ROS otherwise negative.  ____________________________________________   PHYSICAL EXAM:  VITAL SIGNS: ED Triage Vitals  Enc Vitals Group     BP 07/20/16 1251 108/61     Pulse Rate 07/20/16 1251 (!) 110     Resp 07/20/16 1251 18     Temp 07/20/16 1251 98.3 F (36.8 C)     Temp Source 07/20/16 1251 Oral     SpO2 07/20/16 1251 99 %     Weight 07/20/16 1252 123 lb (55.8 kg)     Height 07/20/16 1252 5\' 5"  (1.651 m)     Head Circumference --      Peak Flow --      Pain Score 07/20/16 1251 5     Pain Loc --      Pain Edu? --      Excl. in GC? --      Constitutional: Alert and oriented. Well appearing and in no acute distress. Eyes: Conjunctivae are mildly injected. Head: Atraumatic. ENT:      Ears: TMs visualized bilaterally without effusion, erythema, bulging, perforation.      Nose: Erythematous and swollen tubinates, With clear rhinorrhea.      Mouth/Throat: Mucous membranes are moist. Pharynx with trace injection but no swelling or exudate. Clear postnasal discharge. Uvula is midline. Airways patent. Neck: No stridor. Supple with full range of motion. Hematological/Lymphatic/Immunilogical: No cervical lymphadenopathy. Cardiovascular: Tachycardic rate, regular rhythm. Normal S1 and S2.  No murmurs, rubs, gallops. Good peripheral circulation. Respiratory: Normal respiratory effort without tachypnea or retractions. Lungs CTAB with breath sounds noted in all lung fields. No wheeze, rhonchi, rales. Neurologic:  Normal speech and language. No gross focal neurologic deficits are appreciated.  Skin:  Skin is warm, dry and intact. No rash noted. Psychiatric: Mood and affect are normal. Speech and behavior are normal. Patient exhibits appropriate  insight and judgement.   ____________________________________________   LABS  None ____________________________________________  EKG  None ____________________________________________  RADIOLOGY  None ____________________________________________    PROCEDURES  Procedure(s) performed: None   Procedures   Medications - No data to display   ____________________________________________   INITIAL IMPRESSION / ASSESSMENT AND PLAN / ED COURSE  Pertinent labs & imaging results that were available during my care of the patient were reviewed by me and considered in my medical decision making (see chart for details).  Clinical Course    Patient's diagnosis is consistent with Acute maxillary sinusitis. Patient's heart rate was noted to be slightly elevated while in the emergency department today. She has been on Sudafed over the last 4-5 days as well as being pregnant would cause a slight elevation in heart rate. Patient is aware and should discontinue use of Sudafed at this time. She has had no chest pain, shortness breath nor palpitations and was overall well-appearing without distress at the time of discharge. Patient will be discharged home with prescriptions for azithromycin and Flonase to take as directed. Patient was also given prescription for albuterol inhaler to use as needed. [facility] if symptoms persist past this treatment course. Patient is given ED precautions to return to the ED for any worsening or new symptoms.    ____________________________________________  FINAL CLINICAL IMPRESSION(S) / ED DIAGNOSES  Final diagnoses:  Acute maxillary sinusitis, recurrence not specified      NEW MEDICATIONS STARTED DURING THIS VISIT:  Discharge Medication List as of 07/20/2016  1:53 PM    START taking these medications   Details  azithromycin (ZITHROMAX Z-PAK) 250 MG tablet Take 2 tablets (500 mg) on  Day 1,  followed by 1  tablet (250 mg) once daily on Days 2 through 5., Print    fluticasone (FLONASE) 50 MCG/ACT nasal spray Place 2 sprays into both nostrils daily., Starting Wed 07/20/2016, Print             Ernestene KielJami L ParnellHagler, PA-C 07/20/16 1412    Emily FilbertJonathan E Williams, MD 07/20/16 [phone]

## 2016-07-20 NOTE — ED Notes (Signed)
AAOx3.  Skin warm and dry.  Ambulates with easy and steady gait. NAD 

## 2016-07-20 NOTE — ED Triage Notes (Signed)
Pt states she is [redacted] weeks pregnant and having cough with sinus and chest congestion for the past 4-5 days..Marland Kitchen

## 2016-10-20 ENCOUNTER — Encounter (HOSPITAL_COMMUNITY): Payer: Self-pay

## 2017-04-05 ENCOUNTER — Encounter: Payer: Self-pay | Admitting: Emergency Medicine

## 2017-04-05 ENCOUNTER — Emergency Department
Admission: EM | Admit: 2017-04-05 | Discharge: 2017-04-05 | Disposition: A | Discharge status: Home / Self Care | Payer: Commercial Managed Care - PPO | Attending: Emergency Medicine | Admitting: Emergency Medicine

## 2017-04-05 DIAGNOSIS — N939 Abnormal uterine and vaginal bleeding, unspecified: Secondary | ICD-10-CM | POA: Diagnosis not present

## 2017-04-05 DIAGNOSIS — J45909 Unspecified asthma, uncomplicated: Secondary | ICD-10-CM | POA: Insufficient documentation

## 2017-04-05 DIAGNOSIS — R102 Pelvic and perineal pain: Secondary | ICD-10-CM | POA: Diagnosis present

## 2017-04-05 DIAGNOSIS — N898 Other specified noninflammatory disorders of vagina: Secondary | ICD-10-CM | POA: Diagnosis not present

## 2017-04-05 LAB — CBC
HCT: 42.7 % (ref 35.0–47.0)
Hemoglobin: 14.6 g/dL (ref 12.0–16.0)
MCH: 30.8 pg (ref 26.0–34.0)
MCHC: 34.2 g/dL (ref 32.0–36.0)
MCV: 90.1 fL (ref 80.0–100.0)
PLATELETS: 208 10*3/uL (ref 150–440)
RBC: 4.74 MIL/uL (ref 3.80–5.20)
RDW: 12.4 % (ref 11.5–14.5)
WBC: 8.6 10*3/uL (ref 3.6–11.0)

## 2017-04-05 LAB — URINALYSIS, COMPLETE (UACMP) WITH MICROSCOPIC
Bacteria, UA: NONE SEEN
Bilirubin Urine: NEGATIVE
Glucose, UA: NEGATIVE mg/dL
Ketones, ur: NEGATIVE mg/dL
Leukocytes, UA: NEGATIVE
Nitrite: NEGATIVE
PH: 6 (ref 5.0–8.0)
Protein, ur: NEGATIVE mg/dL
SPECIFIC GRAVITY, URINE: 1.017 (ref 1.005–1.030)

## 2017-04-05 LAB — COMPREHENSIVE METABOLIC PANEL
ALK PHOS: 67 U/L (ref 38–126)
ALT: 15 U/L (ref 14–54)
AST: 21 U/L (ref 15–41)
Albumin: 4.7 g/dL (ref 3.5–5.0)
Anion gap: 8 (ref 5–15)
BUN: 7 mg/dL (ref 6–20)
CALCIUM: 9.8 mg/dL (ref 8.9–10.3)
CHLORIDE: 104 mmol/L (ref 101–111)
CO2: 28 mmol/L (ref 22–32)
CREATININE: 0.64 mg/dL (ref 0.44–1.00)
GFR calc Af Amer: >60 mL/min (ref >60)
Glucose, Bld: 133 mg/dL — ABNORMAL HIGH (ref 65–99)
Potassium: 3.7 mmol/L (ref 3.5–5.1)
Sodium: 140 mmol/L (ref 135–145)
Total Bilirubin: 0.9 mg/dL (ref 0.3–1.2)
Total Protein: 7.8 g/dL (ref 6.5–8.1)

## 2017-04-05 LAB — WET PREP, GENITAL
CLUE CELLS WET PREP: NONE SEEN
Sperm: NONE SEEN
TRICH WET PREP: NONE SEEN
YEAST WET PREP: NONE SEEN

## 2017-04-05 LAB — LIPASE, BLOOD: LIPASE: 33 U/L (ref 11–51)

## 2017-04-05 LAB — CHLAMYDIA/NGC RT PCR (ARMC ONLY)
CHLAMYDIA TR: NOT DETECTED
N GONORRHOEAE: NOT DETECTED

## 2017-04-05 LAB — POCT PREGNANCY, URINE: Preg Test, Ur: NEGATIVE

## 2017-04-05 MED ORDER — IBUPROFEN 400 MG PO TABS
600.0000 mg | ORAL_TABLET | Freq: Once | ORAL | Status: AC
  Administered 2017-04-05: 600 mg via ORAL
  Filled 2017-04-05: qty 2

## 2017-04-05 NOTE — ED Notes (Signed)
Pt. States she woke today with sharp lower abdominal cramping.  Pt. States she had a tubal ligation in Sept. Of last year.  Pt. States some vaginal discharge this a.m with a blood tinge to it.

## 2017-04-05 NOTE — Discharge Instructions (Signed)
You may take Tylenol or Motrin for ear pain. Please make an appointment to follow up with your doctor for the results of your gonorrhea and chlamydia testing.  Return to the emergency department if he develops severe pain, fever, or any other symptoms concerning to you.

## 2017-04-05 NOTE — ED Provider Notes (Signed)
[facility] Emergency Department Provider Note  ____________________________________________  Time seen: Approximately 9:41 PM  I have reviewed the triage vital signs and the nursing notes.   HISTORY  Chief Complaint Abdominal Pain    HPI Julie Lawson is a 26 y.o. female who is sexually active with a history of bacterial vaginosis, status post BTL, presenting with pelvic pain associated with abnormal vaginal discharge. The symptoms started today. She describes her discharge as "like a boiled egg." No fevers, chills, nausea or vomiting. No diarrhea or constipation. She has not tried any medication for her pain.   Past Medical History:  Diagnosis Date  . Asthma    no history of exacerbations since childhood.    Patient Active Problem List   Diagnosis Date Noted  . Labor and delivery, indication for care 06/25/2016  . Poor weight gain of pregnancy 04/12/2016  . Supervision of normal pregnancy in second trimester 04/12/2016  . Constipation in pregnancy in first trimester 02/15/2016  . GBS bacteriuria 12/19/2015  . Rubella non-immune status, antepartum 12/17/2015  . Hemorrhoid 04/14/2014  . 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 No pertinent surgical history.  Current Outpatient Rx  . Order #: [id number] Class: Print  . Order #: [id number] Class: Print  . Order #: [id number] Class: Normal  . Order #: [id number] Class: Historical Med  . Order #: [id number] Class: Print  . Order #: [id number] Class: Normal  . Order #: [id number] Class: Normal    Allergies Amoxicillin and Bee venom  Family History  Problem Relation Age of Onset  . Migraines Mother   . Diabetes Maternal Grandmother   . Heart failure Maternal Grandfather   . Arthritis Other        surgery    Social History Social History  Substance Use Topics  . Smoking status: Never Smoker  . Smokeless tobacco: Never Used  . Alcohol use No    Review of Systems Constitutional: No  fever/chills. Eyes: No visual changes. ENT: No sore throat. No congestion or rhinorrhea. Cardiovascular: Denies chest pain. Denies palpitations. Respiratory: Denies shortness of breath.  No cough. Gastrointestinal: No abdominal pain.  No nausea, no vomiting.  No diarrhea.  No constipation. Genitourinary: Negative for dysuria. Positive vaginal discharge. Positive pelvic pain. Musculoskeletal: Negative for back pain. Skin: Negative for rash. Neurological: Negative for headaches. No focal numbness, tingling or weakness.   10-point ROS otherwise negative.  ____________________________________________   PHYSICAL EXAM:  VITAL SIGNS: ED Triage Vitals  Enc Vitals Group     BP --      Pulse Rate 04/05/17 2126 67     Resp 04/05/17 2126 16     Temp 04/05/17 2126 97.4 F (36.3 C)     Temp Source 04/05/17 2126 Oral     SpO2 04/05/17 2126 99 %     Weight 04/05/17 2127 110 lb (49.9 kg)     Height 04/05/17 2127 5\' 1"  (1.549 m)     Head Circumference --      Peak Flow --      Pain Score --      Pain Loc --      Pain Edu? --      Excl. in GC? --     Constitutional: Alert and oriented. Well appearing and in no acute distress. Answers questions appropriately. Eyes: Conjunctivae are normal.  EOMI. No scleral icterus. Head: Atraumatic. Nose: No congestion/rhinnorhea. Mouth/Throat: Mucous membranes are moist.  Neck: No stridor.  Supple.   Cardiovascular: Normal rate, regular rhythm. No murmurs,  rubs or gallops.  Respiratory: Normal respiratory effort.  No accessory muscle use or retractions. Lungs CTAB.  No wheezes, rales or ronchi. Gastrointestinal: Soft, nontender and nondistended.  No guarding or rebound.  No peritoneal signs. Genitourinary: Normal-appearing external genitalia without lesions. Vaginal exam with Minimal blood and mucus discharge, normal-appearing cervix, normal vaginal wall tissue. Bimanual exam is negative for CMT, adnexal tenderness to palpation, no palpable masses. Mild  tenderness to palpation suprapubically. Musculoskeletal: No LE edema.  Neurologic:  A&Ox3.  Speech is clear.  Face and smile are symmetric.  EOMI.  Moves all extremities well. Skin:  Skin is warm, dry and intact. No rash noted. Psychiatric: Mood and affect are normal. Speech and behavior are normal.  Normal judgement.  ____________________________________________   LABS (all labs ordered are listed, but only abnormal results are displayed)  Labs Reviewed  WET PREP, GENITAL - Abnormal; Notable for the following:       Result Value   WBC, Wet Prep HPF POC FEW (*)    All other components within normal limits  COMPREHENSIVE METABOLIC PANEL - Abnormal; Notable for the following:    Glucose, Bld 133 (*)    All other components within normal limits  URINALYSIS, COMPLETE (UACMP) WITH MICROSCOPIC - Abnormal; Notable for the following:    Color, Urine YELLOW (*)    APPearance CLEAR (*)    Hgb urine dipstick MODERATE (*)    Squamous Epithelial / LPF 0-5 (*)    All other components within normal limits  CHLAMYDIA/NGC RT PCR ([facility] ONLY)  LIPASE, BLOOD  CBC  POC URINE PREG, ED  POCT PREGNANCY, URINE   ____________________________________________  EKG  Not indicated ____________________________________________  RADIOLOGY  No results found.  ____________________________________________   PROCEDURES  Procedure(s) performed: None  Procedures  Critical Care performed: No ____________________________________________   INITIAL IMPRESSION / ASSESSMENT AND PLAN / ED COURSE  Pertinent labs & imaging results that were available during my care of the patient were reviewed by me and considered in my medical decision making (see chart for details).  26 y.o. female presenting with vaginal discharge and pelvic pain. Overall, the patient is hemodynamically stable. Her abdominal examination is reassuring. We will perform a pelvic examination with cultures, urinalysis, and basic  laboratory studies for further evaluation. I'll treat the patient with Motrin for her discomfort.  ----------------------------------------- 10:17 PM on 04/05/2017 -----------------------------------------  The patient's wet prep does not show any infection. I will have the patient follow up with her gynecologist at [facility] for her chlamydia and gonorrhea testing. It is possible her discharge, which is now not present, was due to sexual intercourse last night. The patient is stable for discharge. Return precautions as well as follow-up instructions were discussed. ____________________________________________  FINAL CLINICAL IMPRESSION(S) / ED DIAGNOSES  Final diagnoses:  Vaginal bleeding  Vaginal discharge  Suprapubic pain         NEW MEDICATIONS STARTED DURING THIS VISIT:  New Prescriptions   No medications on file      Rockne Menghini, MD 04/05/17 2219

## 2017-04-05 NOTE — ED Triage Notes (Signed)
Pt ambulatory to triage with steady gait, no distress noted. Pt c/o lower abdominal pain x1 day with blood tinged vaginal discharge. Pt reports she had a tubal ligation in September and has not had a period or bleeding since.

## 2017-08-03 ENCOUNTER — Emergency Department: Payer: Commercial Managed Care - PPO

## 2017-08-03 ENCOUNTER — Emergency Department
Admission: EM | Admit: 2017-08-03 | Discharge: 2017-08-03 | Disposition: A | Discharge status: Home / Self Care | Payer: Commercial Managed Care - PPO | Attending: Emergency Medicine | Admitting: Emergency Medicine

## 2017-08-03 ENCOUNTER — Encounter: Payer: Self-pay | Admitting: Medical Oncology

## 2017-08-03 DIAGNOSIS — R197 Diarrhea, unspecified: Secondary | ICD-10-CM | POA: Insufficient documentation

## 2017-08-03 DIAGNOSIS — R1010 Upper abdominal pain, unspecified: Secondary | ICD-10-CM | POA: Diagnosis present

## 2017-08-03 LAB — URINALYSIS, COMPLETE (UACMP) WITH MICROSCOPIC
BACTERIA UA: NONE SEEN
BILIRUBIN URINE: NEGATIVE
Glucose, UA: NEGATIVE mg/dL
Ketones, ur: NEGATIVE mg/dL
NITRITE: NEGATIVE
Protein, ur: NEGATIVE mg/dL
SPECIFIC GRAVITY, URINE: 1.016 (ref 1.005–1.030)
pH: 6 (ref 5.0–8.0)

## 2017-08-03 LAB — CBC WITH DIFFERENTIAL/PLATELET
BASOS PCT: 0 %
Basophils Absolute: 0 10*3/uL (ref 0–0.1)
EOS ABS: 0.1 10*3/uL (ref 0–0.7)
EOS PCT: 2 %
HCT: 40.7 % (ref 35.0–47.0)
Hemoglobin: 13.8 g/dL (ref 12.0–16.0)
LYMPHS ABS: 2 10*3/uL (ref 1.0–3.6)
Lymphocytes Relative: 33 %
MCH: 31.3 pg (ref 26.0–34.0)
MCHC: 34 g/dL (ref 32.0–36.0)
MCV: 92.3 fL (ref 80.0–100.0)
MONOS PCT: 5 %
Monocytes Absolute: 0.3 10*3/uL (ref 0.2–0.9)
Neutro Abs: 3.7 10*3/uL (ref 1.4–6.5)
Neutrophils Relative %: 60 %
Platelets: 197 10*3/uL (ref 150–440)
RBC: 4.41 MIL/uL (ref 3.80–5.20)
RDW: 12.6 % (ref 11.5–14.5)
WBC: 6.1 10*3/uL (ref 3.6–11.0)

## 2017-08-03 LAB — CHLAMYDIA/NGC RT PCR (ARMC ONLY)
Chlamydia Tr: NOT DETECTED
N GONORRHOEAE: NOT DETECTED

## 2017-08-03 LAB — COMPREHENSIVE METABOLIC PANEL
ALBUMIN: 4.4 g/dL (ref 3.5–5.0)
ALK PHOS: 54 U/L (ref 38–126)
ALT: 27 U/L (ref 14–54)
ANION GAP: 7 (ref 5–15)
AST: 24 U/L (ref 15–41)
BUN: 9 mg/dL (ref 6–20)
CALCIUM: 9.5 mg/dL (ref 8.9–10.3)
CO2: 23 mmol/L (ref 22–32)
Chloride: 108 mmol/L (ref 101–111)
Creatinine, Ser: 0.67 mg/dL (ref 0.44–1.00)
GFR calc non Af Amer: >60 mL/min (ref >60)
GLUCOSE: 93 mg/dL (ref 65–99)
POTASSIUM: 3.6 mmol/L (ref 3.5–5.1)
SODIUM: 138 mmol/L (ref 135–145)
TOTAL PROTEIN: 7.5 g/dL (ref 6.5–8.1)
Total Bilirubin: 0.8 mg/dL (ref 0.3–1.2)

## 2017-08-03 LAB — LIPASE, BLOOD: Lipase: 28 U/L (ref 11–51)

## 2017-08-03 LAB — POCT PREGNANCY, URINE: Preg Test, Ur: NEGATIVE

## 2017-08-03 MED ORDER — LOPERAMIDE HCL 2 MG PO TABS: 2.0000 mg | ORAL_TABLET | Freq: Four times a day (QID) | ORAL | 0 refills | Status: AC | PRN

## 2017-08-03 MED ORDER — ONDANSETRON HCL 4 MG/2ML IJ SOLN
4.0000 mg | Freq: Once | INTRAMUSCULAR | Status: AC
  Administered 2017-08-03: 4 mg via INTRAVENOUS
  Filled 2017-08-03: qty 2

## 2017-08-03 MED ORDER — DICYCLOMINE HCL 20 MG PO TABS: 20.0000 mg | ORAL_TABLET | Freq: Three times a day (TID) | ORAL | 0 refills | Status: AC | PRN

## 2017-08-03 MED ORDER — MORPHINE SULFATE (PF) 4 MG/ML IV SOLN
4.0000 mg | Freq: Once | INTRAVENOUS | Status: AC
  Administered 2017-08-03: 4 mg via INTRAVENOUS
  Filled 2017-08-03: qty 1

## 2017-08-03 MED ORDER — SODIUM CHLORIDE 0.9 % IV SOLN
Freq: Once | INTRAVENOUS | Status: AC
  Administered 2017-08-03: 09:00:00 via INTRAVENOUS

## 2017-08-03 NOTE — ED Triage Notes (Signed)
Pt reports lower abd pain with diarrhea x 3 days.

## 2017-08-03 NOTE — ED Provider Notes (Signed)
[facility] Emergency Department Provider Note       Time seen: ----------------------------------------- 8:23 AM on 08/03/2017 -----------------------------------------     I have reviewed the triage vital signs and the nursing notes.   HISTORY   Chief Complaint Abdominal Pain and Diarrhea    HPI Julie Lawson is a 26 y.o. female who presents to the ED for abdominal pain for the last 3 days with persistent diarrhea starting last night. Patient denies a history of this before, has not had any fever or vomiting. She denies any specific abdominal pain. She has had a tubal ligation and does not think she could be pregnant.  Past Medical History:  Diagnosis Date  . Asthma    no history of exacerbations since childhood.    Patient Active Problem List   Diagnosis Date Noted  . Labor and delivery, indication for care 06/25/2016  . Poor weight gain of pregnancy 04/12/2016  . Supervision of normal pregnancy in second trimester 04/12/2016  . Constipation in pregnancy in first trimester 02/15/2016  . GBS bacteriuria 12/19/2015  . Rubella non-immune status, antepartum 12/17/2015  . Hemorrhoid 04/14/2014  . 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  Allergies Amoxicillin and Bee venom  Social History Social History  Substance Use Topics  . Smoking status: Never Smoker  . Smokeless tobacco: Never Used  . Alcohol use No   Review of Systems Constitutional: Negative for fever. Eyes: Negative for vision changes ENT:  Negative for congestion, sore throat Cardiovascular: Negative for chest pain. Respiratory: Negative for shortness of breath. Gastrointestinal:Positive for abdominal pain, diarrhea Genitourinary: Negative for dysuria. Musculoskeletal: Negative for back pain. Skin: Negative for rash. Neurological: Negative for headaches, focal weakness or numbness.  All systems negative/normal/unremarkable except as  stated in the HPI  ____________________________________________   PHYSICAL EXAM:  VITAL SIGNS: ED Triage Vitals [08/03/17 0812]  Enc Vitals Group     BP 123/75     Pulse Rate 63     Resp 16     Temp 98.1 F (36.7 C)     Temp Source Oral     SpO2 97 %     Weight 100 lb (45.4 kg)     Height  (1.575 m)     Head Circumference      Peak Flow      Pain Score 5     Pain Loc      Pain Edu?      Excl. in GC?    Constitutional: Alert and oriented. Well appearing and in no distress. Eyes: Conjunctivae are normal. Normal extraocular movements. ENT   Head: Normocephalic and atraumatic.   Nose: No congestion/rhinnorhea.   Mouth/Throat: Mucous membranes are moist.   Neck: No stridor. Cardiovascular: Normal rate, regular rhythm. No murmurs, rubs, or gallops. Respiratory: Normal respiratory effort without tachypnea nor retractions. Breath sounds are clear and equal bilaterally. No wheezes/rales/rhonchi. Gastrointestinal: Soft and nontender. Normal bowel sounds Musculoskeletal: Nontender with normal range of motion in extremities. No lower extremity tenderness nor edema. Neurologic:  Normal speech and language. No gross focal neurologic deficits are appreciated.  Skin:  Skin is warm, dry and intact. No rash noted. Psychiatric: Mood and affect are normal. Speech and behavior are normal.  ____________________________________________  ED COURSE:  Pertinent labs & imaging results that were available during my care of the patient were reviewed by me and considered in my medical decision making (see chart for details). Patient presents for abdominal  pain and diarrhea likely viral in etiology, we will assess with labs as indicated   Procedures ____________________________________________   LABS (pertinent positives/negatives)  Labs Reviewed  URINALYSIS, COMPLETE (UACMP) WITH MICROSCOPIC - Abnormal; Notable for the following:       Result Value   Color, Urine YELLOW (*)     APPearance HAZY (*)    Hgb urine dipstick MODERATE (*)    Leukocytes, UA SMALL (*)    Squamous Epithelial / LPF 6-30 (*)    All other components within normal limits  CHLAMYDIA/NGC RT PCR ([facility] ONLY)  CBC WITH DIFFERENTIAL/PLATELET  COMPREHENSIVE METABOLIC PANEL  LIPASE, BLOOD  POC URINE PREG, ED  POCT PREGNANCY, URINE  ____________________________________________  FINAL ASSESSMENT AND PLAN  Diarrhea  Plan: Patient's labs were dictated above. Patient had presented for diarrhea and abdominal pain that seems to be viral. she is stable for outpatient follow-up with loperamide and Bentyl.   Emily Filbert, MD   Note: This note was generated in part or whole with voice recognition software. Voice recognition is usually quite accurate but there are transcription errors that can and very often do occur. I apologize for any typographical errors that were not detected and corrected.     Emily Filbert, MD 08/03/17 [phone]

## 2017-09-20 ENCOUNTER — Emergency Department
Admission: EM | Admit: 2017-09-20 | Discharge: 2017-09-20 | Disposition: A | Discharge status: Home / Self Care | Payer: Commercial Managed Care - PPO | Attending: Emergency Medicine | Admitting: Emergency Medicine

## 2017-09-20 ENCOUNTER — Other Ambulatory Visit: Payer: Self-pay

## 2017-09-20 DIAGNOSIS — B349 Viral infection, unspecified: Secondary | ICD-10-CM | POA: Diagnosis not present

## 2017-09-20 DIAGNOSIS — J45909 Unspecified asthma, uncomplicated: Secondary | ICD-10-CM | POA: Diagnosis not present

## 2017-09-20 DIAGNOSIS — J029 Acute pharyngitis, unspecified: Secondary | ICD-10-CM | POA: Diagnosis present

## 2017-09-20 DIAGNOSIS — Z79899 Other long term (current) drug therapy: Secondary | ICD-10-CM | POA: Diagnosis not present

## 2017-09-20 LAB — POCT RAPID STREP A: STREPTOCOCCUS, GROUP A SCREEN (DIRECT): NEGATIVE

## 2017-09-20 MED ORDER — PSEUDOEPH-BROMPHEN-DM 30-2-10 MG/5ML PO SYRP: 5.0000 mL | ORAL_SOLUTION | Freq: Four times a day (QID) | ORAL | 0 refills | Status: AC | PRN

## 2017-09-20 NOTE — ED Provider Notes (Signed)
[facility] Emergency Department Provider Note  ____________________________________________  Time seen: Approximately 8:03 PM  I have reviewed the triage vital signs and the nursing notes.   HISTORY  Chief Complaint Sore Throat   HPI Julie Lawson is a 26 y.o. female who presents to the emergency department for evaluation and treatment of coughing, sneezing, sore throat, nasal congestion, and feeling of water in her ears for the past2 days. She has tried taking Alka-Seltzer cold without any relief. She denies fever. She is on no daily medications and states that her only allergy to medication is amoxicillin.   Past Medical History:  Diagnosis Date  . Asthma    no history of exacerbations since childhood.    Patient Active Problem List   Diagnosis Date Noted  . Labor and delivery, indication for care 06/25/2016  . Poor weight gain of pregnancy 04/12/2016  . Supervision of normal pregnancy in second trimester 04/12/2016  . Constipation in pregnancy in first trimester 02/15/2016  . GBS bacteriuria 12/19/2015  . Rubella non-immune status, antepartum 12/17/2015  . Hemorrhoid 04/14/2014  . 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  Prior to Admission medications   Medication Sig Start Date End Date Taking? Authorizing Provider  albuterol (PROVENTIL HFA;VENTOLIN HFA) 108 (90 Base) MCG/ACT inhaler Inhale 1-2 puffs into the lungs every 6 (six) hours as needed for wheezing or shortness of breath. Patient not taking: Reported on 08/03/2017 07/20/16   Hagler, Jami L, PA-C  brompheniramine-pseudoephedrine-DM 30-2-10 MG/5ML syrup Take 5 mLs 4 (four) times daily as needed by mouth. 09/20/17   Kailin Principato, Rulon Eisenmengerari B, FNP  dicyclomine (BENTYL) 20 MG tablet Take 1 tablet (20 mg total) by mouth 3 (three) times daily as needed for spasms. 08/03/17   Emily FilbertWilliams, Jonathan E, MD  docusate sodium (COLACE) 100 MG capsule Take 1 capsule (100 mg total) by  mouth 2 (two) times daily. Patient not taking: Reported on 03/08/2016 12/16/15   Purcell NailsShambley, Melody N, CNM  EPINEPHrine (EPIPEN 2-PAK) 0.3 mg/0.3 mL IJ SOAJ injection Inject 0.3 mg into the muscle once.    [provider]  fluticasone (FLONASE) 50 MCG/ACT nasal spray Place 2 sprays into both nostrils daily. Patient not taking: Reported on 08/03/2017 07/20/16   Hagler, Jami L, PA-C  loperamide (IMODIUM A-D) 2 MG tablet Take 1 tablet (2 mg total) by mouth 4 (four) times daily as needed for diarrhea or loose stools. 08/03/17   Emily FilbertWilliams, Jonathan E, MD  Nutritional Supplements (ENSURE HEALTHY MOM) LIQD Take 1 Bottle by mouth 2 (two) times daily at 8 am and 10 pm. Patient not taking: Reported on 06/24/2016 04/12/16   Hildred Laserherry, Anika, MD  Prenat-FeFum-FePo-FA-Omega 3 (CONCEPT DHA) 53.5-38-1 MG CAPS Take 1 tablet by mouth daily. Patient not taking: Reported on 06/24/2016 12/16/15   Purcell NailsShambley, Melody N, CNM    Allergies Amoxicillin and Bee venom  Family History  Problem Relation Age of Onset  . Migraines Mother   . Diabetes Maternal Grandmother   . Heart failure Maternal Grandfather   . Arthritis Other        surgery    Social History Social History   Tobacco Use  . Smoking status: Never Smoker  . Smokeless tobacco: Never Used  Substance Use Topics  . Alcohol use: No    Alcohol/week: 0.0 oz  . Drug use: No    Review of Systems Constitutional: Negative for fever/chills ENT: Positive for sore throat. Cardiovascular: Denies chest pain. Respiratory: Negative for  shortness of breath. Positive for cough. Gastrointestinal: Negative for nausea,  negative vomiting.  No diarrhea.  Musculoskeletal: Negative for body aches Skin: Negative for rash. Neurological: Negative for headaches ____________________________________________   PHYSICAL EXAM:  VITAL SIGNS: ED Triage Vitals  Enc Vitals Group     BP 09/20/17 1911 104/67     Pulse Rate 09/20/17 1911 87     Resp 09/20/17 1911 16     Temp  09/20/17 1911 98.4 F (36.9 C)     Temp Source 09/20/17 1911 Oral     SpO2 09/20/17 1911 100 %     Weight 09/20/17 1911 100 lb (45.4 kg)     Height 09/20/17 1911 5\' 2"  (1.575 m)     Head Circumference --      Peak Flow --      Pain Score 09/20/17 1916 5     Pain Loc --      Pain Edu? --      Excl. in GC? --     Constitutional: Alert and oriented. Acutely ill appearing and in no acute distress. Eyes: Conjunctivae are normal. EOMI. Ears: Serous fluid present behind tympanic membrane bilaterally. Tympanic membranes appear mildly erythematous, but light reflex is intact. Nose: Sinus congestion noted; clear rhinnorhea. Mouth/Throat: Mucous membranes are moist.  Oropharynx erythematous. Tonsils 1+ without exudate. Neck: No stridor.  Lymphatic: Left anterior cervical lymphadenopathy is present. Cardiovascular: Normal rate, regular rhythm. Good peripheral circulation. Respiratory: Normal respiratory effort.  No retractions. Breath sounds clear to auscultation. Gastrointestinal: Soft and nontender.  Musculoskeletal: FROM x 4 extremities.  Neurologic:  Normal speech and language.  Skin:  Skin is warm, dry and intact. No rash noted. Psychiatric: Mood and affect are normal. Speech and behavior are normal.  ____________________________________________   LABS (all labs ordered are listed, but only abnormal results are displayed)  Labs Reviewed  CULTURE, GROUP A STREP [facility])  POCT RAPID STREP A   ____________________________________________  EKG  Not indicated ____________________________________________  RADIOLOGY  Not indicated ____________________________________________   PROCEDURES  Procedure(s) performed: None  Critical Care performed: No ____________________________________________   INITIAL IMPRESSION / ASSESSMENT AND PLAN / ED COURSE  26 year old female presenting to the emergency department for evaluation and treatment of symptoms most compatible with upper  respiratory viral illness. She will be treated with Bromfed and encouraged to follow up with her primary care provider for symptoms that are not improving over the next week. She was encouraged to take Tylenol or ibuprofen if needed for pain or fever. She is instructed to return to the emergency department for symptoms that change or worsen if she is unable schedule an appointment.  Pertinent labs & imaging results that were available during my care of the patient were reviewed by me and considered in my medical decision making (see chart for details).  If controlled substance prescribed during this visit, 12 month history viewed on the [facility] prior to issuing an initial prescription for Schedule II or III opiod. ____________________________________________   FINAL CLINICAL IMPRESSION(S) / ED DIAGNOSES  Final diagnoses:  Acute viral syndrome    Note:  This document was prepared using Dragon voice recognition software and may include unintentional dictation errors.     Chinita Pesterriplett, Louis Ivery B, FNP 09/20/17 2007    Dionne BucySiadecki, Sebastian, MD 09/20/17 2316

## 2017-09-20 NOTE — ED Notes (Signed)
Pt has a sore throat and states her ears feel like they have fluid in them.  No fever.  Non smoker.  No cough.   Pt alert.

## 2017-09-20 NOTE — ED Triage Notes (Signed)
Pt presents to ED c/o sore throat x 2 days, states she feels like a lump to L throat. States nasal congestion and feels like water in her ears. Alert, oriented, ambulatory. States painful when swallowing.

## 2017-09-20 NOTE — Discharge Instructions (Signed)
With the primary care provider for symptoms that are not improving over the week. Return to the emergency department for symptoms that change or worsen if you are unable schedule an appointment.

## 2017-09-23 LAB — CULTURE, GROUP A STREP (THRC)

## 2017-11-19 ENCOUNTER — Other Ambulatory Visit: Payer: Self-pay

## 2017-11-19 ENCOUNTER — Encounter (HOSPITAL_COMMUNITY): Payer: Self-pay | Admitting: Emergency Medicine

## 2017-11-19 ENCOUNTER — Emergency Department (HOSPITAL_COMMUNITY)
Admission: EM | Admit: 2017-11-19 | Discharge: 2017-11-19 | Disposition: A | Discharge status: Home / Self Care | Payer: Commercial Managed Care - PPO | Attending: Emergency Medicine | Admitting: Emergency Medicine

## 2017-11-19 DIAGNOSIS — R350 Frequency of micturition: Secondary | ICD-10-CM | POA: Insufficient documentation

## 2017-11-19 DIAGNOSIS — J45909 Unspecified asthma, uncomplicated: Secondary | ICD-10-CM | POA: Diagnosis not present

## 2017-11-19 DIAGNOSIS — Z79899 Other long term (current) drug therapy: Secondary | ICD-10-CM | POA: Insufficient documentation

## 2017-11-19 DIAGNOSIS — R300 Dysuria: Secondary | ICD-10-CM | POA: Diagnosis present

## 2017-11-19 LAB — URINALYSIS, ROUTINE W REFLEX MICROSCOPIC
BILIRUBIN URINE: NEGATIVE
GLUCOSE, UA: NEGATIVE mg/dL
Hgb urine dipstick: NEGATIVE
KETONES UR: NEGATIVE mg/dL
LEUKOCYTES UA: NEGATIVE
Nitrite: NEGATIVE
PROTEIN: NEGATIVE mg/dL
Specific Gravity, Urine: 1.008 (ref 1.005–1.030)
pH: 6 (ref 5.0–8.0)

## 2017-11-19 LAB — PREGNANCY, URINE: Preg Test, Ur: NEGATIVE

## 2017-11-19 MED ORDER — PHENAZOPYRIDINE HCL 200 MG PO TABS: 200.0000 mg | ORAL_TABLET | Freq: Three times a day (TID) | ORAL | 0 refills | Status: AC

## 2017-11-19 NOTE — Discharge Instructions (Signed)
Urine sample today is normal without infection. We will confirm this with a urine culture. Results will return in 1-2 days. You will be contacted if urine culture shows infection.   For now we will treat symptoms with pyridium. This medicine can make your urine bright orange/yellow/red. This is normal. Stay well hydrated. Take tylenol/ibuprofen for abdominal discomfort.

## 2017-11-19 NOTE — ED Provider Notes (Signed)
[facility] EMERGENCY DEPARTMENT Provider Note   CSN: [id number] Arrival date & time: 11/19/17  1400     History   Chief Complaint Chief Complaint  Patient presents with  . Dysuria    HPI Julie Lawson is a 27 y.o. female G3P3 female s/p BTL presents to ED for evaluation of urinary frequency, LLQ abdominal pressure before urination since last night. Denies frank dysuria, hematuria, fevers, chills, nausea, vomiting, vaginal discharge or bleeding. She has irregular periods but LMP was 1.5 weeks ago. She is sexually active with female partners without barrier contraceptive. She is not concerned for STDs. H/o UTIs during pregnancy.   HPI  Past Medical History:  Diagnosis Date  . Asthma    no history of exacerbations since childhood.    Patient Active Problem List   Diagnosis Date Noted  . Labor and delivery, indication for care 06/25/2016  . Poor weight gain of pregnancy 04/12/2016  . Supervision of normal pregnancy in second trimester 04/12/2016  . Constipation in pregnancy in first trimester 02/15/2016  . GBS bacteriuria 12/19/2015  . Rubella non-immune status, antepartum 12/17/2015  . Hemorrhoid 04/14/2014  . 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 No pertinent surgical history.  OB History    Gravida Para Term Preterm AB Living   3 2 2     2    SAB TAB Ectopic Multiple Live Births           2       Home Medications    Prior to Admission medications   Medication Sig Start Date End Date Taking? Authorizing Provider  albuterol (PROVENTIL HFA;VENTOLIN HFA) 108 (90 Base) MCG/ACT inhaler Inhale 1-2 puffs into the lungs every 6 (six) hours as needed for wheezing or shortness of breath. Patient not taking: Reported on 08/03/2017 07/20/16   Hagler, Jami L, PA-C  brompheniramine-pseudoephedrine-DM 30-2-10 MG/5ML syrup Take 5 mLs 4 (four) times daily as needed by mouth. 09/20/17   Triplett, Rulon Eisenmenger B, FNP  dicyclomine (BENTYL) 20 MG tablet Take 1 tablet (20 mg  total) by mouth 3 (three) times daily as needed for spasms. 08/03/17   Emily Filbert, MD  docusate sodium (COLACE) 100 MG capsule Take 1 capsule (100 mg total) by mouth 2 (two) times daily. Patient not taking: Reported on 03/08/2016 12/16/15   Purcell Nails, CNM  EPINEPHrine (EPIPEN 2-PAK) 0.3 mg/0.3 mL IJ SOAJ injection Inject 0.3 mg into the muscle once.    [provider]  fluticasone (FLONASE) 50 MCG/ACT nasal spray Place 2 sprays into both nostrils daily. Patient not taking: Reported on 08/03/2017 07/20/16   Hagler, Jami L, PA-C  loperamide (IMODIUM A-D) 2 MG tablet Take 1 tablet (2 mg total) by mouth 4 (four) times daily as needed for diarrhea or loose stools. 08/03/17   Emily Filbert, MD  Nutritional Supplements (ENSURE HEALTHY MOM) LIQD Take 1 Bottle by mouth 2 (two) times daily at 8 am and 10 pm. Patient not taking: Reported on 06/24/2016 04/12/16   Hildred Laser, MD  phenazopyridine (PYRIDIUM) 200 MG tablet Take 1 tablet (200 mg total) by mouth 3 (three) times daily. 11/19/17   Liberty Handy, PA-C  Prenat-FeFum-FePo-FA-Omega 3 (CONCEPT DHA) 53.5-38-1 MG CAPS Take 1 tablet by mouth daily. Patient not taking: Reported on 06/24/2016 12/16/15   Purcell Nails, CNM    Family History Family History  Problem Relation Age of Onset  . Migraines Mother   . Diabetes Maternal Grandmother   . Heart failure  Maternal Grandfather   . Arthritis Other        surgery    Social History Social History   Tobacco Use  . Smoking status: Never Smoker  . Smokeless tobacco: Never Used  Substance Use Topics  . Alcohol use: No    Alcohol/week: 0.0 oz  . Drug use: No     Allergies   Amoxicillin and Bee venom   Review of Systems Review of Systems  Genitourinary: Positive for difficulty urinating, frequency and pelvic pain (LLQ "pressure" with urination only).  All other systems reviewed and are negative.    Physical Exam Updated Vital Signs BP 111/66 (BP Location:  Right Arm)   Pulse 67   Temp 98 F (36.7 C) (Oral)   Resp 18   Ht 5\' 2"  (1.575 m)   Wt 45.4 kg (100 lb)   LMP 11/10/2017   SpO2 100%   BMI 18.29 kg/m   Physical Exam  Constitutional: She is oriented to person, place, and time. She appears well-developed and well-nourished. No distress.  NAD.  HENT:  Head: Normocephalic and atraumatic.  Right Ear: External ear normal.  Left Ear: External ear normal.  Nose: Nose normal.  Eyes: Conjunctivae and EOM are normal. No scleral icterus.  Neck: Normal range of motion. Neck supple.  Cardiovascular: Normal rate, regular rhythm and normal heart sounds.  No murmur heard. Pulmonary/Chest: Effort normal and breath sounds normal. She has no wheezes.  Abdominal: Soft. There is no tenderness.  BS normal. No suprapubic or CVA tenderness. No lower abdominal tenderness. No G/R/R.   Musculoskeletal: Normal range of motion. She exhibits no deformity.  Neurological: She is alert and oriented to person, place, and time.  Skin: Skin is warm and dry. Capillary refill takes less than 2 seconds.  Psychiatric: She has a normal mood and affect. Her behavior is normal. Judgment and thought content normal.  Nursing note and vitals reviewed.    ED Treatments / Results  Labs (all labs ordered are listed, but only abnormal results are displayed) Labs Reviewed  URINE CULTURE  URINALYSIS, ROUTINE W REFLEX MICROSCOPIC  PREGNANCY, URINE    EKG  EKG Interpretation None       Radiology No results found.  Procedures Procedures (including critical care time)  Medications Ordered in ED Medications - No data to display   Initial Impression / Assessment and Plan / ED Course  I have reviewed the triage vital signs and the nursing notes.  Pertinent labs & imaging results that were available during my care of the patient were reviewed by me and considered in my medical decision making (see chart for details).     27 yo female presents with concern  for UTI. LLQ pressure before urination, relieved with voiding and urinary frequency. No fevers, chills, flank pain, or frank dysuria. No abnormal vaginal discharge or bleeding. She is not concerned for STD.   Exam unremarkable. No abdominal tenderness. Her LLQ pressure is only right before urination and then resolves. No CVAT. She has no s/s that would warrant pelvic exam.   U/A completely normal. Not pregnant. Will send urine for culture. Will defer UTI antibiotics today until culture. Discussed conservative tx with pt who is agreeable. Will rx pyridium. Discussed return precautions.   Final Clinical Impressions(s) / ED Diagnoses   Final diagnoses:  Urinary frequency    ED Discharge Orders        Ordered    phenazopyridine (PYRIDIUM) 200 MG tablet  3 times daily  11/19/17 1628       Liberty Handy, PA-C 11/19/17 1628    Vanetta Mulders, MD 11/20/17 [phone]

## 2017-11-19 NOTE — ED Triage Notes (Signed)
Patient c/o dysuria, pressure, and urinary frequency that started yesterday. Denies any nausea, vomiting, or diarrhea. Pain in left lower pelvic region with urination.

## 2017-11-19 NOTE — ED Notes (Signed)
EDP at bedside  

## 2017-11-21 LAB — URINE CULTURE

## 2018-01-07 ENCOUNTER — Other Ambulatory Visit: Payer: Self-pay

## 2018-01-07 ENCOUNTER — Encounter (HOSPITAL_COMMUNITY): Payer: Self-pay | Admitting: Emergency Medicine

## 2018-01-07 ENCOUNTER — Emergency Department (HOSPITAL_COMMUNITY)
Admission: EM | Admit: 2018-01-07 | Discharge: 2018-01-07 | Disposition: A | Discharge status: Home / Self Care | Payer: Commercial Managed Care - PPO | Attending: Emergency Medicine | Admitting: Emergency Medicine

## 2018-01-07 DIAGNOSIS — H6690 Otitis media, unspecified, unspecified ear: Secondary | ICD-10-CM

## 2018-01-07 DIAGNOSIS — Z79899 Other long term (current) drug therapy: Secondary | ICD-10-CM | POA: Diagnosis not present

## 2018-01-07 DIAGNOSIS — J45909 Unspecified asthma, uncomplicated: Secondary | ICD-10-CM | POA: Diagnosis not present

## 2018-01-07 DIAGNOSIS — H9201 Otalgia, right ear: Secondary | ICD-10-CM | POA: Diagnosis present

## 2018-01-07 DIAGNOSIS — H6691 Otitis media, unspecified, right ear: Secondary | ICD-10-CM | POA: Diagnosis not present

## 2018-01-07 MED ORDER — AZITHROMYCIN 250 MG PO TABS: ORAL_TABLET | ORAL | 0 refills | Status: DC

## 2018-01-07 MED ORDER — ANTIPYRINE-BENZOCAINE 5.4-1.4 % OT SOLN: 3.0000 [drp] | OTIC | 0 refills | Status: AC | PRN

## 2018-01-07 NOTE — ED Triage Notes (Signed)
Pt reports R ear pain X3 days. States she has had a cold for approx 45 days and using OTC medications. Denies fever or drainage from ear. Was putting a q-tip in ear and noticed some blood on the end.

## 2018-01-07 NOTE — Discharge Instructions (Signed)
Tylenol or ibuprofen if needed for pain or fever,.  Follow-up with your doctor for recheck in a few days if not improving

## 2018-01-08 NOTE — ED Provider Notes (Signed)
[facility] EMERGENCY DEPARTMENT Provider Note   CSN: [id number] Arrival date & time: 01/07/18  1022     History   Chief Complaint Chief Complaint  Patient presents with  . Otalgia    HPI ALELI NAVEDO is a 27 y.o. female.  HPI   ANNISSA ANDREONI is a 27 y.o. female who presents to the Emergency Department complaining of pain and decreased hearing form the right ear. Symptoms present for 3 days.  Symptoms began after a persistent "cold" she has been taking OTC cold and cough medications without relief.  Noticed a "full" sensation to her right ear and used a Q-tip in her ear prior to arrival and saw blood on the Q-tip.  Hearing diminished prior to using the swab.  Denies cough, fever, headache and dizziness.   Past Medical History:  Diagnosis Date  . Asthma    no history of exacerbations since childhood.    Patient Active Problem List   Diagnosis Date Noted  . Labor and delivery, indication for care 06/25/2016  . Poor weight gain of pregnancy 04/12/2016  . Supervision of normal pregnancy in second trimester 04/12/2016  . Constipation in pregnancy in first trimester 02/15/2016  . GBS bacteriuria 12/19/2015  . Rubella non-immune status, antepartum 12/17/2015  . Hemorrhoid 04/14/2014  . 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 No pertinent surgical history.  OB History    Gravida Para Term Preterm AB Living   3 2 2     2    SAB TAB Ectopic Multiple Live Births           2       Home Medications    Prior to Admission medications   Medication Sig Start Date End Date Taking? Authorizing Provider  albuterol (PROVENTIL HFA;VENTOLIN HFA) 108 (90 Base) MCG/ACT inhaler Inhale 1-2 puffs into the lungs every 6 (six) hours as needed for wheezing or shortness of breath. Patient not taking: Reported on 08/03/2017 07/20/16   Hagler, Jami L, PA-C  antipyrine-benzocaine [name]) OTIC solution Place 3-4 drops into the right ear every 2 (two) hours as needed for  ear pain. 01/07/18   Robby Bulkley, PA-C  azithromycin (ZITHROMAX) 250 MG tablet Take first 2 tablets together, then 1 every day until finished. 01/07/18   Bracken Moffa, PA-C  brompheniramine-pseudoephedrine-DM 30-2-10 MG/5ML syrup Take 5 mLs 4 (four) times daily as needed by mouth. 09/20/17   Malky Rudzinski, Rulon Eisenmenger B, FNP  dicyclomine (BENTYL) 20 MG tablet Take 1 tablet (20 mg total) by mouth 3 (three) times daily as needed for spasms. 08/03/17   Emily Filbert, MD  docusate sodium (COLACE) 100 MG capsule Take 1 capsule (100 mg total) by mouth 2 (two) times daily. Patient not taking: Reported on 03/08/2016 12/16/15   Purcell Nails, CNM  EPINEPHrine (EPIPEN 2-PAK) 0.3 mg/0.3 mL IJ SOAJ injection Inject 0.3 mg into the muscle once.    [provider]  fluticasone (FLONASE) 50 MCG/ACT nasal spray Place 2 sprays into both nostrils daily. Patient not taking: Reported on 08/03/2017 07/20/16   Hagler, Jami L, PA-C  loperamide (IMODIUM A-D) 2 MG tablet Take 1 tablet (2 mg total) by mouth 4 (four) times daily as needed for diarrhea or loose stools. 08/03/17   Emily Filbert, MD  Nutritional Supplements (ENSURE HEALTHY MOM) LIQD Take 1 Bottle by mouth 2 (two) times daily at 8 am and 10 pm. Patient not taking: Reported on 06/24/2016 04/12/16   Hildred Laser, MD  phenazopyridine (PYRIDIUM)  200 MG tablet Take 1 tablet (200 mg total) by mouth 3 (three) times daily. 11/19/17   Liberty Handy, PA-C  Prenat-FeFum-FePo-FA-Omega 3 (CONCEPT DHA) 53.5-38-1 MG CAPS Take 1 tablet by mouth daily. Patient not taking: Reported on 06/24/2016 12/16/15   Purcell Nails, CNM    Family History Family History  Problem Relation Age of Onset  . Migraines Mother   . Diabetes Maternal Grandmother   . Heart failure Maternal Grandfather   . Arthritis Other        surgery    Social History Social History   Tobacco Use  . Smoking status: Never Smoker  . Smokeless tobacco: Never Used  Substance Use Topics  .  Alcohol use: No    Alcohol/week: 0.0 oz  . Drug use: No     Allergies   Amoxicillin and Bee venom   Review of Systems Review of Systems  Constitutional: Negative for activity change, appetite change, chills and fever.  HENT: Positive for ear discharge, ear pain and hearing loss. Negative for congestion, facial swelling, rhinorrhea, sore throat and trouble swallowing.   Eyes: Negative for visual disturbance.  Respiratory: Negative for cough, shortness of breath, wheezing and stridor.   Gastrointestinal: Negative for nausea and vomiting.  Musculoskeletal: Negative for neck pain and neck stiffness.  Skin: Negative for rash.  Neurological: Negative for dizziness, weakness, numbness and headaches.  Hematological: Negative for adenopathy.  Psychiatric/Behavioral: Negative for confusion.  All other systems reviewed and are negative.    Physical Exam Updated Vital Signs BP 107/74 (BP Location: Left Arm)   Pulse 74   Temp 98.3 F (36.8 C) (Oral)   Resp 16   Ht 5\' 2"  (1.575 m)   Wt 45.4 kg (100 lb)   LMP 12/07/2017 (Approximate)   SpO2 100%   BMI 18.29 kg/m   Physical Exam  Constitutional: She is oriented to person, place, and time. She appears well-developed and well-nourished. No distress.  HENT:  Head: Normocephalic and atraumatic.  Right Ear: Ear canal normal. There is tenderness. No drainage or swelling. No mastoid tenderness. Tympanic membrane is erythematous. Tympanic membrane is not bulging. No hemotympanum.  Left Ear: Tympanic membrane and ear canal normal.  Mouth/Throat: Uvula is midline, oropharynx is clear and moist and mucous membranes are normal. No uvula swelling. No oropharyngeal exudate.  Erythema of the right TM w/o perforation.  No blood seen in the canal.  Neck: Normal range of motion. Neck supple.  Cardiovascular: Normal rate, regular rhythm and intact distal pulses.  No murmur heard. Pulmonary/Chest: Effort normal and breath sounds normal. No stridor. No  respiratory distress.  Lymphadenopathy:    She has no cervical adenopathy.  Neurological: She is alert and oriented to person, place, and time. Coordination normal.  Skin: Skin is warm and dry. No rash noted.  Psychiatric: She has a normal mood and affect.  Nursing note and vitals reviewed.    ED Treatments / Results  Labs (all labs ordered are listed, but only abnormal results are displayed) Labs Reviewed - No data to display  EKG  EKG Interpretation None       Radiology No results found.  Procedures Procedures (including critical care time)  Medications Ordered in ED Medications - No data to display   Initial Impression / Assessment and Plan / ED Course  I have reviewed the triage vital signs and the nursing notes.  Pertinent labs & imaging results that were available during my care of the patient were reviewed by me  and considered in my medical decision making (see chart for details).     Pt with acute right OM.  Vitals wnml.  Pt agrees to tx plan, PCP f/u if needed.   Final Clinical Impressions(s) / ED Diagnoses   Final diagnoses:  Acute otitis media, unspecified otitis media type    ED Discharge Orders        Ordered    azithromycin (ZITHROMAX) 250 MG tablet     01/07/18 1148    antipyrine-benzocaine (AURALGAN) OTIC solution  Every 2 hours PRN     01/07/18 [address]       Mallery Harshman, [place]ammy, PA-C 01/08/18 1325    Bethann BerkshireZammit, Joseph, MD 01/08/18 1916

## 2018-06-04 ENCOUNTER — Emergency Department
Admission: EM | Admit: 2018-06-04 | Discharge: 2018-06-04 | Disposition: A | Discharge status: Home / Self Care | Payer: Commercial Managed Care - PPO | Attending: Student in an Organized Health Care Education/Training Program | Admitting: Student in an Organized Health Care Education/Training Program

## 2018-06-04 ENCOUNTER — Other Ambulatory Visit: Payer: Self-pay

## 2018-06-04 DIAGNOSIS — Z79899 Other long term (current) drug therapy: Secondary | ICD-10-CM | POA: Insufficient documentation

## 2018-06-04 DIAGNOSIS — R197 Diarrhea, unspecified: Secondary | ICD-10-CM | POA: Insufficient documentation

## 2018-06-04 DIAGNOSIS — R109 Unspecified abdominal pain: Secondary | ICD-10-CM | POA: Insufficient documentation

## 2018-06-04 DIAGNOSIS — R519 Headache, unspecified: Secondary | ICD-10-CM

## 2018-06-04 DIAGNOSIS — R51 Headache: Secondary | ICD-10-CM | POA: Insufficient documentation

## 2018-06-04 DIAGNOSIS — J45909 Unspecified asthma, uncomplicated: Secondary | ICD-10-CM | POA: Insufficient documentation

## 2018-06-04 LAB — URINALYSIS, COMPLETE (UACMP) WITH MICROSCOPIC
BACTERIA UA: NONE SEEN
BILIRUBIN URINE: NEGATIVE
Glucose, UA: NEGATIVE mg/dL
Hgb urine dipstick: NEGATIVE
KETONES UR: NEGATIVE mg/dL
LEUKOCYTES UA: NEGATIVE
Nitrite: NEGATIVE
PH: 6 (ref 5.0–8.0)
Protein, ur: NEGATIVE mg/dL
SPECIFIC GRAVITY, URINE: 1.008 (ref 1.005–1.030)

## 2018-06-04 LAB — COMPREHENSIVE METABOLIC PANEL
ALBUMIN: 4.2 g/dL (ref 3.5–5.0)
ALK PHOS: 53 U/L (ref 38–126)
ALT: 12 U/L (ref 0–44)
ANION GAP: 4 — AB (ref 5–15)
AST: 17 U/L (ref 15–41)
BUN: 8 mg/dL (ref 6–20)
CHLORIDE: 109 mmol/L (ref 98–111)
CO2: 28 mmol/L (ref 22–32)
Calcium: 9.6 mg/dL (ref 8.9–10.3)
Creatinine, Ser: 0.56 mg/dL (ref 0.44–1.00)
GFR calc non Af Amer: >60 mL/min (ref >60)
Glucose, Bld: 117 mg/dL — ABNORMAL HIGH (ref 70–99)
Potassium: 3.4 mmol/L — ABNORMAL LOW (ref 3.5–5.1)
SODIUM: 141 mmol/L (ref 135–145)
Total Bilirubin: 1.2 mg/dL (ref 0.3–1.2)
Total Protein: 7.2 g/dL (ref 6.5–8.1)

## 2018-06-04 LAB — CBC
HEMATOCRIT: 39.3 % (ref 35.0–47.0)
Hemoglobin: 13.4 g/dL (ref 12.0–16.0)
MCH: 31.5 pg (ref 26.0–34.0)
MCHC: 34 g/dL (ref 32.0–36.0)
MCV: 92.8 fL (ref 80.0–100.0)
PLATELETS: 208 10*3/uL (ref 150–440)
RBC: 4.24 MIL/uL (ref 3.80–5.20)
RDW: 12.6 % (ref 11.5–14.5)
WBC: 9.2 10*3/uL (ref 3.6–11.0)

## 2018-06-04 LAB — LIPASE, BLOOD: Lipase: 45 U/L (ref 11–51)

## 2018-06-04 LAB — POCT PREGNANCY, URINE: PREG TEST UR: NEGATIVE

## 2018-06-04 MED ORDER — PROCHLORPERAZINE MALEATE 10 MG PO TABS: 10.0000 mg | ORAL_TABLET | Freq: Four times a day (QID) | ORAL | 0 refills | Status: AC | PRN

## 2018-06-04 MED ORDER — SODIUM CHLORIDE 0.9 % IV BOLUS
1000.0000 mL | Freq: Once | INTRAVENOUS | Status: AC
  Administered 2018-06-04: 1000 mL via INTRAVENOUS

## 2018-06-04 MED ORDER — ACETAMINOPHEN 500 MG PO TABS
1000.0000 mg | ORAL_TABLET | Freq: Once | ORAL | Status: AC
  Administered 2018-06-04: 1000 mg via ORAL
  Filled 2018-06-04: qty 2

## 2018-06-04 MED ORDER — PROCHLORPERAZINE EDISYLATE 10 MG/2ML IJ SOLN
10.0000 mg | Freq: Once | INTRAMUSCULAR | Status: AC
  Administered 2018-06-04: 10 mg via INTRAVENOUS
  Filled 2018-06-04: qty 2

## 2018-06-04 MED ORDER — PROCHLORPERAZINE MALEATE 10 MG PO TABS
10.0000 mg | ORAL_TABLET | Freq: Once | ORAL | Status: DC
  Filled 2018-06-04: qty 1

## 2018-06-04 NOTE — ED Triage Notes (Signed)
Pt arrives to ED via POV from home with c/o headache x3 days and abdominal pain that started today. Pt denies N/V, but (+) diarrhea (4 times in last 24 hrs).

## 2018-06-04 NOTE — ED Provider Notes (Signed)
[facility] Emergency Department Provider Note    First MD Initiated Contact with Patient 06/04/18 2115     (approximate)  I have reviewed the triage vital signs and the nursing notes.   HISTORY  Chief Complaint Abdominal Pain and Headache    HPI Julie Lawson is a 27 y.o. female no significant past medical history presents the ER chief complaint of 3 days of headache as well as crampy abdominal pain followed by 4 episodes of nonbloody non-melanotic diarrhea today.  Denies any fevers.  No neck stiffness.  States the headache is mild and frontal in location.  No associated blurry vision photophobia or phonophobia.  No numbness or tingling.  Is not sudden onset headache.  Does have a family history of migraines.  Patient is coming by her husband who states that she has had poor hydration for the past few days and only drinks tea or caffeinated beverages.    Past Medical History:  Diagnosis Date  . Asthma    no history of exacerbations since childhood.   Family History  Problem Relation Age of Onset  . Migraines Mother   . Diabetes Maternal Grandmother   . Heart failure Maternal Grandfather   . Arthritis Other        surgery   History reviewed. No pertinent surgical history. Patient Active Problem List   Diagnosis Date Noted  . Labor and delivery, indication for care 06/25/2016  . Poor weight gain of pregnancy 04/12/2016  . Supervision of normal pregnancy in second trimester 04/12/2016  . Constipation in pregnancy in first trimester 02/15/2016  . GBS bacteriuria 12/19/2015  . Rubella non-immune status, antepartum 12/17/2015  . Hemorrhoid 04/14/2014  . 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 Authorizing Provider  albuterol (PROVENTIL HFA;VENTOLIN HFA) 108 (90 Base) MCG/ACT inhaler Inhale 1-2 puffs into the lungs every 6 (six) hours as needed for wheezing or shortness of  breath. Patient not taking: Reported on 08/03/2017 07/20/16   Hagler, Jami L, PA-C  antipyrine-benzocaine [name]) OTIC solution Place 3-4 drops into the right ear every 2 (two) hours as needed for ear pain. 01/07/18   Triplett, Tammy, PA-C  azithromycin (ZITHROMAX) 250 MG tablet Take first 2 tablets together, then 1 every day until finished. 01/07/18   Triplett, Tammy, PA-C  brompheniramine-pseudoephedrine-DM 30-2-10 MG/5ML syrup Take 5 mLs 4 (four) times daily as needed by mouth. 09/20/17   Triplett, Rulon Eisenmenger B, FNP  dicyclomine (BENTYL) 20 MG tablet Take 1 tablet (20 mg total) by mouth 3 (three) times daily as needed for spasms. 08/03/17   Emily Filbert, MD  docusate sodium (COLACE) 100 MG capsule Take 1 capsule (100 mg total) by mouth 2 (two) times daily. Patient not taking: Reported on 03/08/2016 12/16/15   Purcell Nails, CNM  EPINEPHrine (EPIPEN 2-PAK) 0.3 mg/0.3 mL IJ SOAJ injection Inject 0.3 mg into the muscle once.    [provider]  fluticasone (FLONASE) 50 MCG/ACT nasal spray Place 2 sprays into both nostrils daily. Patient not taking: Reported on 08/03/2017 07/20/16   Hagler, Jami L, PA-C  loperamide (IMODIUM A-D) 2 MG tablet Take 1 tablet (2 mg total) by mouth 4 (four) times daily as needed for diarrhea or loose stools. 08/03/17   Emily Filbert, MD  Nutritional Supplements (ENSURE HEALTHY MOM) LIQD Take 1 Bottle by mouth 2 (two) times daily at 8 am and 10 pm. Patient not  taking: Reported on 06/24/2016 04/12/16   Hildred Laserherry, Anika, MD  phenazopyridine (PYRIDIUM) 200 MG tablet Take 1 tablet (200 mg total) by mouth 3 (three) times daily. 11/19/17   Liberty HandyGibbons, Claudia J, PA-C  Prenat-FeFum-FePo-FA-Omega 3 (CONCEPT DHA) 53.5-38-1 MG CAPS Take 1 tablet by mouth daily. Patient not taking: Reported on 06/24/2016 12/16/15   Purcell NailsShambley, Melody N, CNM    Allergies Amoxicillin and Bee venom    Social History Social History   Tobacco Use  . Smoking status: Never Smoker  . Smokeless  tobacco: Never Used  Substance Use Topics  . Alcohol use: No    Alcohol/week: 0.0 oz  . Drug use: No    Review of Systems Patient denies headaches, rhinorrhea, blurry vision, numbness, shortness of breath, chest pain, edema, cough, abdominal pain, nausea, vomiting, diarrhea, dysuria, fevers, rashes or hallucinations unless otherwise stated above in HPI. ____________________________________________   PHYSICAL EXAM:  VITAL SIGNS: Vitals:   06/04/18 2058  BP: 104/65  Pulse: 79  Resp: 18  Temp: 98.4 F (36.9 C)  SpO2: 100%    Constitutional: Alert and oriented.  Eyes: Conjunctivae are normal.  Head: Atraumatic. Nose: No congestion/rhinnorhea. Mouth/Throat: Mucous membranes are moist.   Neck: No stridor. Painless ROM.  Cardiovascular: Normal rate, regular rhythm. Grossly normal heart sounds.  Good peripheral circulation. Respiratory: Normal respiratory effort.  No retractions. Lungs CTAB. Gastrointestinal: Soft and nontender. No distention. No abdominal bruits. No CVA tenderness. Genitourinary:  Musculoskeletal: No lower extremity tenderness nor edema.  No joint effusions. Neurologic:  CN- intact.  No facial droop, Normal FNF.  Normal heel to shin.  Sensation intact bilaterally. Normal speech and language. No gross focal neurologic deficits are appreciated. No gait instability.  Skin:  Skin is warm, dry and intact. No rash noted. Psychiatric: Mood and affect are normal. Speech and behavior are normal.  ____________________________________________   LABS (all labs ordered are listed, but only abnormal results are displayed)  Results for orders placed or performed during the hospital encounter of 06/04/18 (from the past 24 hour(s))  Pregnancy, urine POC     Status: None   Collection Time: 06/04/18  9:06 PM  Result Value Ref Range   Preg Test, Ur NEGATIVE NEGATIVE    ____________________________________________ ____________________________________________  RADIOLOGY   ____________________________________________   PROCEDURES  Procedure(s) performed:  Procedures    Critical Care performed: no ____________________________________________   INITIAL IMPRESSION / ASSESSMENT AND PLAN / ED COURSE  Pertinent labs & imaging results that were available during my care of the patient were reviewed by me and considered in my medical decision making (see chart for details).   DDX: tension, migraine, cluster, unlikely meningitis, sah, iph  Julie Lawson is a 27 y.o. who with symptoms as described above.  Complaining of mild headache for 3 days.  Not the worst headache of her life.  She has no signs of meningismus she is afebrile and otherwise well-appearing.  Able to ambulate in the ER no distress.  Neuro exam is nonfocal.  She is not pregnant.  Likely tension, non-specific or possible migraine HA. Clinical picture is not consistent with ICH, SAH, SDH, EDH, TIA, or CVA. No concern for meningitis or encephalitis. No concern for GCA/Temporal arteritis.   ----------------------------------------- 11:06 PM on 06/04/2018 -----------------------------------------   Pain improved. Repeat neuro exam is again without focal deficit, nuchal rigidity or evidence of meningeal irritation.  Stable to D/C home, follow up with PCP or Neurology if persistent recurrent Has.  Have discussed with the patient and available  family all diagnostics and treatments performed thus far and all questions were answered to the best of my ability. The patient demonstrates understanding and agreement with plan.        As part of my medical decision making, I reviewed the following data within the electronic MEDICAL RECORD NUMBER Nursing notes reviewed and incorporated, Labs reviewed, notes from prior ED visits.  ____________________________________________   FINAL CLINICAL  IMPRESSION(S) / ED DIAGNOSES  Final diagnoses:  Bad headache      NEW MEDICATIONS STARTED DURING THIS VISIT:  New Prescriptions   No medications on file     Note:  This document was prepared using Dragon voice recognition software and may include unintentional dictation errors.    Willy Eddy, MD 06/04/18 2306

## 2018-06-04 NOTE — Discharge Instructions (Addendum)

## 2018-06-04 NOTE — ED Notes (Signed)
Richardson Doppole RN, aware of light green redraw

## 2018-10-04 ENCOUNTER — Other Ambulatory Visit: Payer: Self-pay

## 2018-10-04 ENCOUNTER — Encounter (HOSPITAL_COMMUNITY): Payer: Self-pay | Admitting: *Deleted

## 2018-10-04 ENCOUNTER — Emergency Department (HOSPITAL_COMMUNITY)
Admission: EM | Admit: 2018-10-04 | Discharge: 2018-10-04 | Disposition: A | Discharge status: Home / Self Care | Payer: Self-pay | Attending: Emergency Medicine | Admitting: Emergency Medicine

## 2018-10-04 DIAGNOSIS — J45909 Unspecified asthma, uncomplicated: Secondary | ICD-10-CM | POA: Insufficient documentation

## 2018-10-04 DIAGNOSIS — Z79899 Other long term (current) drug therapy: Secondary | ICD-10-CM | POA: Insufficient documentation

## 2018-10-04 DIAGNOSIS — K029 Dental caries, unspecified: Secondary | ICD-10-CM | POA: Insufficient documentation

## 2018-10-04 DIAGNOSIS — K0889 Other specified disorders of teeth and supporting structures: Secondary | ICD-10-CM

## 2018-10-04 MED ORDER — CLINDAMYCIN HCL 150 MG PO CAPS: 300.0000 mg | ORAL_CAPSULE | Freq: Four times a day (QID) | ORAL | 0 refills | Status: DC

## 2018-10-04 MED ORDER — TRAMADOL HCL 50 MG PO TABS: 50.0000 mg | ORAL_TABLET | Freq: Four times a day (QID) | ORAL | 0 refills | Status: AC | PRN

## 2018-10-04 NOTE — ED Provider Notes (Signed)
[facility] EMERGENCY DEPARTMENT Provider Note   CSN: [id number] Arrival date & time: 10/04/18  1003     History   Chief Complaint Chief Complaint  Patient presents with  . Oral Swelling    HPI Julie Lawson is a 27 y.o. female.  HPI   Julie Lawson is a 27 y.o. female who presents to the Emergency Department complaining of left lower dental pain.  Symptoms have been present for several days and worsening.  This morning she noticed swelling of her left upper jaw.  She has been taking over-the-counter BC powders without relief.  She complains of pain is worse with chewing or sensation of hot or cold liquids or food.  She has an appointment with her dentist for tomorrow.  She denies fever, chills, neck pain, difficulty swallowing or shortness of breath.   Past Medical History:  Diagnosis Date  . Asthma    no history of exacerbations since childhood.    Patient Active Problem List   Diagnosis Date Noted  . Labor and delivery, indication for care 06/25/2016  . Poor weight gain of pregnancy 04/12/2016  . Supervision of normal pregnancy in second trimester 04/12/2016  . Constipation in pregnancy in first trimester 02/15/2016  . GBS bacteriuria 12/19/2015  . Rubella non-immune status, antepartum 12/17/2015  . Hemorrhoid 04/14/2014  . 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 No pertinent surgical history.   OB History    Gravida  3   Para  2   Term  2   Preterm      AB      Living  2     SAB      TAB      Ectopic      Multiple      Live Births  2            Home Medications    Prior to Admission medications   Medication Sig Start Date End Date Taking? Authorizing Provider  albuterol (PROVENTIL HFA;VENTOLIN HFA) 108 (90 Base) MCG/ACT inhaler Inhale 1-2 puffs into the lungs every 6 (six) hours as needed for wheezing or shortness of breath. Patient not taking: Reported on 08/03/2017 07/20/16   Hagler, Jami L, PA-C    antipyrine-benzocaine [name]) OTIC solution Place 3-4 drops into the right ear every 2 (two) hours as needed for ear pain. 01/07/18   Leanthony Rhett, PA-C  azithromycin (ZITHROMAX) 250 MG tablet Take first 2 tablets together, then 1 every day until finished. 01/07/18   Emin Foree, PA-C  brompheniramine-pseudoephedrine-DM 30-2-10 MG/5ML syrup Take 5 mLs 4 (four) times daily as needed by mouth. 09/20/17   Clora Ohmer, Rulon Eisenmenger B, FNP  dicyclomine (BENTYL) 20 MG tablet Take 1 tablet (20 mg total) by mouth 3 (three) times daily as needed for spasms. 08/03/17   Emily Filbert, MD  docusate sodium (COLACE) 100 MG capsule Take 1 capsule (100 mg total) by mouth 2 (two) times daily. Patient not taking: Reported on 03/08/2016 12/16/15   Purcell Nails, CNM  EPINEPHrine (EPIPEN 2-PAK) 0.3 mg/0.3 mL IJ SOAJ injection Inject 0.3 mg into the muscle once.    [provider]  fluticasone (FLONASE) 50 MCG/ACT nasal spray Place 2 sprays into both nostrils daily. Patient not taking: Reported on 08/03/2017 07/20/16   Hagler, Jami L, PA-C  loperamide (IMODIUM A-D) 2 MG tablet Take 1 tablet (2 mg total) by mouth 4 (four) times daily as needed for diarrhea or loose stools. 08/03/17   Mayford Knife,  Cecille AmsterdamJonathan E, MD  Nutritional Supplements (ENSURE HEALTHY MOM) LIQD Take 1 Bottle by mouth 2 (two) times daily at 8 am and 10 pm. Patient not taking: Reported on 06/24/2016 04/12/16   Hildred Laserherry, Anika, MD  phenazopyridine (PYRIDIUM) 200 MG tablet Take 1 tablet (200 mg total) by mouth 3 (three) times daily. 11/19/17   Liberty HandyGibbons, Claudia J, PA-C  Prenat-FeFum-FePo-FA-Omega 3 (CONCEPT DHA) 53.5-38-1 MG CAPS Take 1 tablet by mouth daily. Patient not taking: Reported on 06/24/2016 12/16/15   Purcell NailsShambley, Melody N, CNM  prochlorperazine (COMPAZINE) 10 MG tablet Take 1 tablet (10 mg total) by mouth every 6 (six) hours as needed for nausea or vomiting. 06/04/18   Willy Eddyobinson, Patrick, MD    Family History Family History  Problem Relation Age of  Onset  . Migraines Mother   . Diabetes Maternal Grandmother   . Heart failure Maternal Grandfather   . Arthritis Other        surgery    Social History Social History   Tobacco Use  . Smoking status: Never Smoker  . Smokeless tobacco: Never Used  Substance Use Topics  . Alcohol use: No    Alcohol/week: 0.0 standard drinks  . Drug use: No     Allergies   Amoxicillin and Bee venom   Review of Systems Review of Systems  Constitutional: Negative for appetite change and fever.  HENT: Positive for dental problem. Negative for congestion, facial swelling, sore throat and trouble swallowing.   Eyes: Negative for pain and visual disturbance.  Musculoskeletal: Negative for neck pain and neck stiffness.  Neurological: Negative for dizziness, facial asymmetry and headaches.  Hematological: Negative for adenopathy.  All other systems reviewed and are negative.    Physical Exam Updated Vital Signs Pulse 85   Temp 98.4 F (36.9 C)   Resp 20   Ht 5\' 2"  (1.575 m)   Wt 48.1 kg   LMP 10/02/2018 (Exact Date)   SpO2 100%   BMI 19.39 kg/m   Physical Exam  Constitutional: She appears well-developed and well-nourished. No distress.  HENT:  Head: Normocephalic and atraumatic.  Right Ear: Tympanic membrane and ear canal normal.  Left Ear: Tympanic membrane and ear canal normal.  Mouth/Throat: Uvula is midline, oropharynx is clear and moist and mucous membranes are normal. No trismus in the jaw. Dental caries present. No dental abscesses or uvula swelling.  ttp and dental caries of the left lower third molar. Mild edema of the surrounding gums.  No facial swelling, obvious dental abscess, trismus, or sublingual abnml.    Neck: Normal range of motion. Neck supple.  Cardiovascular: Normal rate, regular rhythm and normal heart sounds.  No murmur heard. Pulmonary/Chest: Effort normal and breath sounds normal.  Musculoskeletal: Normal range of motion.  Lymphadenopathy:    She has no  cervical adenopathy.  Neurological: She is alert. No sensory deficit.  Skin: Skin is warm and dry.  Psychiatric: She has a normal mood and affect.  Nursing note and vitals reviewed.    ED Treatments / Results  Labs (all labs ordered are listed, but only abnormal results are displayed) Labs Reviewed - No data to display  EKG None  Radiology No results found.  Procedures Procedures (including critical care time)  Medications Ordered in ED Medications - No data to display   Initial Impression / Assessment and Plan / ED Course  I have reviewed the triage vital signs and the nursing notes.  Pertinent labs & imaging results that were available during my care of  the patient were reviewed by me and considered in my medical decision making (see chart for details).     Patient well-appearing, airway is patent.  No facial edema noted.  Patient has appointment with her dentist for tomorrow.  No concerning symptoms for Ludewig's angina.  Patient appropriate for discharge home  Final Clinical Impressions(s) / ED Diagnoses   Final diagnoses:  Pain, dental    ED Discharge Orders    None       Pauline Aus, PA-C 10/04/18 1126    Long, Arlyss Repress, MD 10/04/18 1736

## 2018-10-04 NOTE — Discharge Instructions (Addendum)
Be sure to keep your appointment with your dentist for tomorrow.  Take the antibiotic as directed until its finished.

## 2018-10-04 NOTE — ED Triage Notes (Signed)
Swelling left upper jaw, has dental pain

## 2019-10-15 ENCOUNTER — Other Ambulatory Visit: Payer: Self-pay

## 2019-10-15 ENCOUNTER — Emergency Department (HOSPITAL_COMMUNITY)
Admission: EM | Admit: 2019-10-15 | Discharge: 2019-10-15 | Disposition: A | Discharge status: Home / Self Care | Payer: Self-pay | Attending: Emergency Medicine | Admitting: Emergency Medicine

## 2019-10-15 ENCOUNTER — Encounter (HOSPITAL_COMMUNITY): Payer: Self-pay

## 2019-10-15 DIAGNOSIS — Z79899 Other long term (current) drug therapy: Secondary | ICD-10-CM | POA: Insufficient documentation

## 2019-10-15 DIAGNOSIS — R21 Rash and other nonspecific skin eruption: Secondary | ICD-10-CM | POA: Insufficient documentation

## 2019-10-15 DIAGNOSIS — J45909 Unspecified asthma, uncomplicated: Secondary | ICD-10-CM | POA: Insufficient documentation

## 2019-10-15 DIAGNOSIS — L299 Pruritus, unspecified: Secondary | ICD-10-CM | POA: Insufficient documentation

## 2019-10-15 MED ORDER — PREDNISONE 10 MG (21) PO TBPK: ORAL_TABLET | ORAL | 0 refills | Status: DC

## 2019-10-15 NOTE — Discharge Instructions (Addendum)
Take steroid taper as directed, Zyrtec can be purchased over-the-counter take this twice daily (about every 12 hours).  I wonder whether dust exposure at work could be contributing to this rash.  If it is not improving it is very important that you follow-up with a dermatologist for further evaluation, I would go ahead and schedule an appointment today, and if rash goes away you can always cancel this appointment.  I have provided office information for several different dermatologists on your paperwork today.  If you develop new or concerning symptoms return to the emergency department.

## 2019-10-15 NOTE — ED Triage Notes (Signed)
Pt has a widespread pruritic rash that has been present for 3 weeks. Has taken Prednisone and Benadryl for 3 weeks with no resolution. Pt has not changed any laundry detergent and states nobody else in her house has this rash.

## 2019-10-15 NOTE — ED Provider Notes (Signed)
[facility] EMERGENCY DEPARTMENT Provider Note   CSN: [id number] Arrival date & time: 10/15/19  4709     History   Chief Complaint Chief Complaint  Patient presents with   Rash    HPI Julie Lawson is a 28 y.o. female.     Julie Lawson is a 28 y.o. female with a history of asthma and seasonal allergies, who presents to the emergency department for evaluation of rash.  Patient reports she has had a pruritic rash for the past 3 weeks.  Rash started on her legs and has spread to her arms and trunk.  Patient initially tried Benadryl and hydrocortisone cream with mild improvement in itching but no resolution of rash.  She was initially seen a few days after rash began on 11/18 at [facility] emergency department, this was felt to likely be allergic or inflammatory reaction and patient was prescribed hydroxyzine and placed on 5-day steroid burst.  Patient reports minimal improvement with steroids and she is continued to have pruritic rash.  She reports raised red areas over her legs, arms and trunk.  Rash seems to be most concentrated on her arms and legs.  She denies any pain redness or warmth over the rash it is just extremely pruritic.  She has not noted any lesions in the mouth or around the eyes.  No lesions over the scalp or hair.  She has not had any pustules or vesicles or drainage of fluid from the rash.  Denies any skin sloughing.  No fevers or chills, nausea, vomiting or general malaise.  She denies any new household products, foods or medications.  The only thing that she has noticed is that the rash started shortly after she began working a new job at [employer] where she is around a lot of dust and wonders if this can be contributing, although rash did show up on her legs first and she does wear long pants while at work.  She reports that no one in the home has a similar rash.  She has not followed up with her PCP or any specialist regarding the symptoms.  No other  aggravating or alleviating factors.  No associated facial swelling, shortness of breath or wheezing.     Past Medical History:  Diagnosis Date   Asthma    no history of exacerbations since childhood.    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 No pertinent surgical history.   OB History    Gravida  3   Para  2   Term  2   Preterm      AB      Living  2     SAB      TAB      Ectopic      Multiple      Live Births  2            Home Medications    Prior to Admission medications   Medication Sig Start Date End Date Taking? Authorizing Provider  albuterol (PROVENTIL HFA;VENTOLIN HFA) 108 (90 Base) MCG/ACT inhaler Inhale 1-2 puffs into the lungs  every 6 (six) hours as needed for wheezing or shortness of breath. Patient not taking: Reported on 08/03/2017 07/20/16   Hagler, Jami L, PA-C  antipyrine-benzocaine Toniann Fail) OTIC solution Place 3-4 drops into the right ear every 2 (two) hours as needed for ear pain. 01/07/18   Triplett, Tammy, PA-C  azithromycin (ZITHROMAX) 250 MG tablet Take first 2 tablets together, then 1 every day until finished. 01/07/18   Triplett, Tammy, PA-C  brompheniramine-pseudoephedrine-DM 30-2-10 MG/5ML syrup Take 5 mLs 4 (four) times daily as needed by mouth. 09/20/17   Triplett, Johnette Abraham B, FNP  clindamycin (CLEOCIN) 150 MG capsule Take 2 capsules (300 mg total) by mouth 4 (four) times daily. 10/04/18   Triplett, Tammy, PA-C  dicyclomine (BENTYL) 20 MG tablet Take 1 tablet (20 mg total) by mouth 3 (three) times daily as needed for spasms. 08/03/17    Earleen Newport, MD  docusate sodium (COLACE) 100 MG capsule Take 1 capsule (100 mg total) by mouth 2 (two) times daily. Patient not taking: Reported on 03/08/2016 12/16/15   Joylene Igo, CNM  EPINEPHrine (EPIPEN 2-PAK) 0.3 mg/0.3 mL IJ SOAJ injection Inject 0.3 mg into the muscle once.    [provider]  fluticasone (FLONASE) 50 MCG/ACT nasal spray Place 2 sprays into both nostrils daily. Patient not taking: Reported on 08/03/2017 07/20/16   Hagler, Jami L, PA-C  loperamide (IMODIUM A-D) 2 MG tablet Take 1 tablet (2 mg total) by mouth 4 (four) times daily as needed for diarrhea or loose stools. 08/03/17   Earleen Newport, MD  Nutritional Supplements (ENSURE HEALTHY MOM) LIQD Take 1 Bottle by mouth 2 (two) times daily at 8 am and 10 pm. Patient not taking: Reported on 06/24/2016 04/12/16   Rubie Maid, MD  phenazopyridine (PYRIDIUM) 200 MG tablet Take 1 tablet (200 mg total) by mouth 3 (three) times daily. 11/19/17   Kinnie Feil, PA-C  Prenat-FeFum-FePo-FA-Omega 3 (CONCEPT DHA) 53.5-38-1 MG CAPS Take 1 tablet by mouth daily. Patient not taking: Reported on 06/24/2016 12/16/15   Joylene Igo, CNM  prochlorperazine (COMPAZINE) 10 MG tablet Take 1 tablet (10 mg total) by mouth every 6 (six) hours as needed for nausea or vomiting. 06/04/18   Merlyn Lot, MD  traMADol (ULTRAM) 50 MG tablet Take 1 tablet (50 mg total) by mouth every 6 (six) hours as needed. 10/04/18   Kem Parkinson, PA-C    Family History Family History  Problem Relation Age of Onset   Migraines Mother    Diabetes Maternal Grandmother    Heart failure Maternal Grandfather    Arthritis Other        surgery    Social History Social History   Tobacco Use   Smoking status: Never Smoker   Smokeless tobacco: Never Used  Substance Use Topics   Alcohol use: No    Alcohol/week: 0.0 standard drinks   Drug use: No     Allergies   Amoxicillin and Bee venom   Review of  Systems Review of Systems  Constitutional: Negative for chills and fever.  HENT: Negative for facial swelling.   Respiratory: Negative for chest tightness, shortness of breath and stridor.   Gastrointestinal: Negative for nausea and vomiting.  Skin: Positive for rash.     Physical Exam Updated Vital Signs BP (!) 134/95 (BP Location: Left Arm)    Pulse 80    Temp 98.2 F (36.8 C) (Oral)    Resp 15    Ht 5\' 2"  (1.575 m)  Wt 51.3 kg    LMP 10/10/2019    SpO2 100%    BMI 20.67 kg/m   Physical Exam Vitals signs and nursing note reviewed.  Constitutional:      General: She is not in acute distress.    Appearance: Normal appearance. She is well-developed and normal weight. She is not ill-appearing or diaphoretic.  HENT:     Head: Normocephalic and atraumatic.     Mouth/Throat:     Comments: Oropharynx is clear with no evidence of mucosal rash or lesions. Eyes:     General:        Right eye: No discharge.        Left eye: No discharge.  Neck:     Comments: No stridor Pulmonary:     Effort: Pulmonary effort is normal. No respiratory distress.  Musculoskeletal:        General: No deformity.  Skin:    General: Skin is warm and dry.     Findings: Rash present.     Comments: Diffuse maculopapular rash noted over the arms, legs and trunk, no vesicles, pustules or petechiae, no skin sloughing, no overlying cellulitis or evidence of abscess.  Neurological:     Mental Status: She is alert and oriented to person, place, and time.     Coordination: Coordination normal.  Psychiatric:        Mood and Affect: Mood normal.        Behavior: Behavior normal.      ED Treatments / Results  Labs (all labs ordered are listed, but only abnormal results are displayed) Labs Reviewed - No data to display  EKG None  Radiology No results found.  Procedures Procedures (including critical care time)  Medications Ordered in ED Medications - No data to display   Initial Impression /  Assessment and Plan / ED Course  I have reviewed the triage vital signs and the nursing notes.  Pertinent labs & imaging results that were available during my care of the patient were reviewed by me and considered in my medical decision making (see chart for details).  Rash likely contact dermatitis or inflammatory, allergic reaction.  Patient denies any difficulty breathing or swallowing.  Pt has a patent airway without stridor and is handling secretions without difficulty; no angioedema. No blisters, no pustules, no warmth, no draining sinus tracts, no superficial abscesses, no bullous impetigo, no vesicles, no desquamation, no target lesions. Not tender to touch. No concern for superimposed infection. No concern for SJS, TEN, TSS, tick borne illness, syphilis or other life-threatening condition.  Given persistence of rash after short burst of steroids, rash may need longer course, will prescribe Sterapred taper, and have patient use Zyrtec twice daily.  Stressed with patient that if this does not improve she will need to follow-up with dermatologist, and gave her options for multiple local dermatology practices.  She expresses understanding and agreement with this plan.  Discharged home in good condition.   Final Clinical Impressions(s) / ED Diagnoses   Final diagnoses:  Rash    ED Discharge Orders         Ordered    predniSONE (STERAPRED UNI-PAK 21 TAB) 10 MG (21) TBPK tablet     10/15/19 1109           Jodi GeraldsFord, Kathy Wares Rose LodgeN, New JerseyPA-C 10/16/19 1020    Vanetta MuldersZackowski, Scott, MD 10/19/19 1515

## 2019-11-24 ENCOUNTER — Emergency Department (HOSPITAL_COMMUNITY): Payer: Self-pay

## 2019-11-24 ENCOUNTER — Other Ambulatory Visit: Payer: Self-pay

## 2019-11-24 ENCOUNTER — Emergency Department (HOSPITAL_COMMUNITY)
Admission: EM | Admit: 2019-11-24 | Discharge: 2019-11-24 | Disposition: A | Discharge status: Home / Self Care | Payer: Self-pay | Attending: Emergency Medicine | Admitting: Emergency Medicine

## 2019-11-24 ENCOUNTER — Encounter (HOSPITAL_COMMUNITY): Payer: Self-pay | Admitting: Emergency Medicine

## 2019-11-24 DIAGNOSIS — Y929 Unspecified place or not applicable: Secondary | ICD-10-CM | POA: Insufficient documentation

## 2019-11-24 DIAGNOSIS — Y9301 Activity, walking, marching and hiking: Secondary | ICD-10-CM | POA: Insufficient documentation

## 2019-11-24 DIAGNOSIS — Y999 Unspecified external cause status: Secondary | ICD-10-CM | POA: Insufficient documentation

## 2019-11-24 DIAGNOSIS — S322XXA Fracture of coccyx, initial encounter for closed fracture: Secondary | ICD-10-CM | POA: Insufficient documentation

## 2019-11-24 DIAGNOSIS — W109XXA Fall (on) (from) unspecified stairs and steps, initial encounter: Secondary | ICD-10-CM | POA: Insufficient documentation

## 2019-11-24 LAB — POC URINE PREG, ED: Preg Test, Ur: NEGATIVE

## 2019-11-24 MED ORDER — HYDROCODONE-ACETAMINOPHEN 5-325 MG PO TABS: 1.0000 | ORAL_TABLET | ORAL | 0 refills | Status: AC | PRN

## 2019-11-24 MED ORDER — HYDROCODONE-ACETAMINOPHEN 5-325 MG PO TABS
1.0000 | ORAL_TABLET | Freq: Once | ORAL | Status: AC
  Administered 2019-11-24: 1 via ORAL
  Filled 2019-11-24: qty 1

## 2019-11-24 NOTE — ED Provider Notes (Signed)
Julie Lawson [facility] EMERGENCY DEPARTMENT Provider Note   CSN: [id number] Arrival date & time: 11/24/19  7209    History Chief Complaint  Patient presents with   Back Pain   Julie Lawson is a 29 y.o. female with medical history significant for asthma who presents for evaluation of sacral pain.  Patient states on Wednesday, 4 days PTA she fell down approximately 5 steps landing on her bottom.  She denies hitting her head, LOC or anticoagulation.  Patient states she slipped and fell as she was trying to carry a computer.  Has been ambulatory since the incident however she notes she has difficulty sitting on her bottom secondary to pain.  She denies any pain with bowel movements, overlying skin changes, radiation of pain to extremities, IV drug use, bowel or bladder incontinence, saddle paresthesia, headache, lightheadedness, dizziness, decreased range of motion to extremities.  She has been taking Tylenol without relief of her symptoms.  She rates her current pain a 7/10.  Denies additional aggravating or alleviating factors.  History obtained from patient and past medical records.  No interpreter is used.  HPI     Past Medical History:  Diagnosis Date   Asthma    no history of exacerbations since childhood.    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   OB History    Gravida  3   Para  2   Term  2   Preterm      AB      Living  2     SAB      TAB      Ectopic      Multiple      Live Births  2           Family History  Problem Relation Age of Onset    Migraines Mother    Diabetes Maternal Grandmother    Heart failure Maternal Grandfather    Arthritis Other        surgery    Social History   Tobacco Use   Smoking status: Never Smoker   Smokeless tobacco: Never Used  Substance Use Topics   Alcohol use: No    Alcohol/week: 0.0 standard drinks   Drug use: No    Home Medications Prior to Admission medications   Medication Sig Start Date End Date Taking? Authorizing Provider  albuterol (PROVENTIL HFA;VENTOLIN HFA) 108 (90 Base) MCG/ACT inhaler Inhale 1-2 puffs into the lungs every 6 (six) hours as needed for wheezing or shortness of breath. Patient not taking: Reported on 08/03/2017 07/20/16   Hagler, Jami L, PA-C  antipyrine-benzocaine [name]) OTIC solution Place 3-4 drops into the right ear every 2 (two) hours as needed for ear pain. Patient not taking: Reported on 10/15/2019 01/07/18   Triplett, Tammy, PA-C  azithromycin (ZITHROMAX) 250 MG tablet Take first 2 tablets together, then 1 every day until finished. Patient not taking: Reported on 10/15/2019 01/07/18   Triplett, Tammy, PA-C  brompheniramine-pseudoephedrine-DM 30-2-10 MG/5ML syrup Take 5 mLs 4 (four) times daily as needed by mouth. Patient not taking: Reported on 10/15/2019 09/20/17  Triplett, Cari B, FNP  clindamycin (CLEOCIN) 150 MG capsule Take 2 capsules (300 mg total) by mouth 4 (four) times daily. Patient not taking: Reported on 10/15/2019 10/04/18   Triplett, Tammy, PA-C  dicyclomine (BENTYL) 20 MG tablet Take 1 tablet (20 mg total) by mouth 3 (three) times daily as needed for spasms. Patient not taking: Reported on 10/15/2019 08/03/17   Earleen Newport, MD  docusate sodium (COLACE) 100 MG capsule Take 1 capsule (100 mg total) by mouth 2 (two) times daily. Patient not taking: Reported on 03/08/2016 12/16/15   Joylene Igo, CNM  EPINEPHrine (EPIPEN 2-PAK) 0.3 mg/0.3 mL IJ SOAJ injection Inject 0.3 mg into the muscle once.    [provider]    fluticasone (FLONASE) 50 MCG/ACT nasal spray Place 2 sprays into both nostrils daily. Patient not taking: Reported on 08/03/2017 07/20/16   Hagler, Jami L, PA-C  HYDROcodone-acetaminophen (NORCO/VICODIN) 5-325 MG tablet Take 1 tablet by mouth every 4 (four) hours as needed. 11/24/19   Bryelle Spiewak A, PA-C  hydrOXYzine (ATARAX/VISTARIL) 25 MG tablet Take 25 mg by mouth every 8 (eight) hours as needed. 10/02/19   [provider]  loperamide (IMODIUM A-D) 2 MG tablet Take 1 tablet (2 mg total) by mouth 4 (four) times daily as needed for diarrhea or loose stools. Patient not taking: Reported on 10/15/2019 08/03/17   Earleen Newport, MD  Nutritional Supplements (ENSURE HEALTHY MOM) LIQD Take 1 Bottle by mouth 2 (two) times daily at 8 am and 10 pm. Patient not taking: Reported on 06/24/2016 04/12/16   Rubie Maid, MD  phenazopyridine (PYRIDIUM) 200 MG tablet Take 1 tablet (200 mg total) by mouth 3 (three) times daily. Patient not taking: Reported on 10/15/2019 11/19/17   Kinnie Feil, PA-C  predniSONE (STERAPRED UNI-PAK 21 TAB) 10 MG (21) TBPK tablet Take 6 tabs by mouth daily  for 2 days, then 5 tabs for 2 days, then 4 tabs for 2 days, then 3 tabs for 2 days, 2 tabs for 2 days, then 1 tab by mouth daily for 2 days 10/15/19   Jacqlyn Larsen, PA-C  Prenat-FeFum-FePo-FA-Omega 3 (CONCEPT DHA) 53.5-38-1 MG CAPS Take 1 tablet by mouth daily. Patient not taking: Reported on 06/24/2016 12/16/15   Joylene Igo, CNM  prochlorperazine (COMPAZINE) 10 MG tablet Take 1 tablet (10 mg total) by mouth every 6 (six) hours as needed for nausea or vomiting. Patient not taking: Reported on 10/15/2019 06/04/18   Merlyn Lot, MD  traMADol (ULTRAM) 50 MG tablet Take 1 tablet (50 mg total) by mouth every 6 (six) hours as needed. Patient not taking: Reported on 10/15/2019 10/04/18   Kem Parkinson, PA-C    Allergies    Amoxicillin and Bee venom  Review of Systems   Review of Systems  Constitutional:  Negative.   HENT: Negative.   Respiratory: Negative.   Cardiovascular: Negative.   Gastrointestinal: Negative.   Genitourinary: Negative.   Musculoskeletal: Positive for back pain. Negative for arthralgias, gait problem, joint swelling, myalgias, neck pain and neck stiffness.       Sacral pain  Skin: Negative.   Neurological: Negative.   All other systems reviewed and are negative.  Physical Exam Updated Vital Signs BP 114/77 (BP Location: Right Arm)    Pulse 72    Temp 98.3 F (36.8 C) (Oral)    Resp 16    Ht 5\' 2"  (1.575 m)    Wt 49.9 kg    SpO2 100%  BMI 20.12 kg/m   Physical Exam  Physical Exam  Constitutional: Pt appears well-developed and well-nourished. No distress.  HENT:  Head: Normocephalic and atraumatic.  Mouth/Throat: Oropharynx is clear and moist. No oropharyngeal exudate.  Eyes: Conjunctivae are normal.  Neck: Normal range of motion. Neck supple.  Full ROM without pain  Cardiovascular: Normal rate, regular rhythm and intact distal pulses.   Pulmonary/Chest: Effort normal and breath sounds normal. No respiratory distress. Pt has no wheezes.  Abdominal: Soft. Pt exhibits no distension. There is no tenderness, rebound or guarding. No abd bruit or pulsatile mass Musculoskeletal:  Full range of motion of the T-spine and L-spine with flexion, hyperextension, and lateral flexion. No midline tenderness or stepoffs. No tenderness to palpation of the spinous processes of the T-spine or L-spine. No tenderness to palpation of the paraspinous muscles of the L-spine. negative straight leg raise. Tenderness over coxyx. No crepitus, stepoffs. Lymphadenopathy:    Pt has no cervical adenopathy.  Neurological: Pt is alert. Pt has normal reflexes.  Reflex Scores:      Bicep reflexes are 2+ on the right side and 2+ on the left side.      Brachioradialis reflexes are 2+ on the right side and 2+ on the left side.      Patellar reflexes are 2+ on the right side and 2+ on the left  side.      Achilles reflexes are 2+ on the right side and 2+ on the left side. Speech is clear and goal oriented, follows commands Normal 5/5 strength in upper and lower extremities bilaterally including dorsiflexion and plantar flexion, strong and equal grip strength Sensation normal to light and sharp touch Moves extremities without ataxia, coordination intact Normal gait Normal balance No Clonus Skin: Skin is warm and dry. No rash noted or lesions noted. Pt is not diaphoretic. No erythema, ecchymosis,edema or warmth.  Psychiatric: Pt has a normal mood and affect. Behavior is normal.  Nursing note and vitals reviewed. ED Results / Procedures / Treatments   Labs (all labs ordered are listed, but only abnormal results are displayed) Labs Reviewed  POC URINE PREG, ED    EKG None  Radiology DG Lumbar Spine Complete  Result Date: 11/24/2019 CLINICAL DATA:  Pain status post fall. EXAM: LUMBAR SPINE - COMPLETE 4+ VIEW COMPARISON:  None. FINDINGS: There is no acute displaced fracture involving the lumbar spine. There are no significant degenerative changes. There is a mildly displaced fracture through the coccyx. IMPRESSION: 1. No acute lumbar spine fracture. 2. Mildly displaced fracture of the coccyx. Electronically Signed   By: Katherine Mantle M.D.   On: 11/24/2019 19:43   DG Sacrum/Coccyx  Result Date: 11/24/2019 CLINICAL DATA:  Pain status post fall. EXAM: SACRUM AND COCCYX - 2+ VIEW COMPARISON:  None. FINDINGS: Findings suspicious for a minimally displaced fracture through the coccyx. There are no significant degenerative changes of the sacroiliac joints. Phleboliths project over the patient's pelvis. IMPRESSION: Minimally displaced fracture of the coccyx. Electronically Signed   By: Katherine Mantle M.D.   On: 11/24/2019 19:42    Procedures Procedures (including critical care time)  Medications Ordered in ED Medications  HYDROcodone-acetaminophen (NORCO/VICODIN) 5-325 MG per  tablet 1 tablet (1 tablet Oral Given 11/24/19 1944)    ED Course  I have reviewed the triage vital signs and the nursing notes.  Pertinent labs & imaging results that were available during my care of the patient were reviewed by me and considered in my medical decision making (see  chart for details).  29 year old female appears otherwise well presents for evaluation of sacral pain after mechanical fall.  She denies hitting head, LOC.  No red flags for back pain.  She has normal musculoskeletal exam except for some tenderness over her coccyx.  No overlying skin changes.  Neurovascularly intact.  Ambulatory without difficulty.  Pregnancy test negative.  Sacral films with coccyx fracture.  RICE for symptomatic management.  Short course of pain medicine, discussed donut pillow outpatient follow-up orthopedics if symptoms unresolved.  No suspicion for acute intracranial, intrathoracic or intra-abdominal trauma.  The patient has been appropriately medically screened and/or stabilized in the ED. I have low suspicion for any other emergent medical condition which would require further screening, evaluation or treatment in the ED or require inpatient management.    MDM Rules/Calculators/A&P                       Final Clinical Impression(s) / ED Diagnoses Final diagnoses:  Closed fracture of coccyx, initial encounter [facility])    Rx / DC Orders ED Discharge Orders         Ordered    HYDROcodone-acetaminophen (NORCO/VICODIN) 5-325 MG tablet  Every 4 hours PRN     11/24/19 2020           Dewarren Ledbetter A, PA-C 11/24/19 2024    Bethann Berkshire, MD 11/24/19 2141

## 2019-11-24 NOTE — Discharge Instructions (Signed)
You may purchase a donut pillow the local [facility] or [employer] to sit on  Take pain medicine as prescribed.  These medications

## 2019-11-24 NOTE — ED Triage Notes (Signed)
PT STATES THAT SHE FELL DOWN ABOUT 6 STEPS HURTING HER BOTTOM.

## 2019-12-06 ENCOUNTER — Ambulatory Visit: Payer: Self-pay | Admitting: Orthopedic Surgery

## 2019-12-06 NOTE — Progress Notes (Deleted)
EMERGENCY ROOM FOLLOW UP  NEW PROBLEM/PATIENT   Patient ID: Julie Lawson, female   DOB: Jan 23, 1991, 29 y.o.   MRN: [id number]  Emergency room record from (date) *** has been reviewed and this is included by reference and includes the review of systems with the following addition:   No chief complaint on file.   HPI Julie Lawson is a 29 y.o. female.  ***  HPI  Review of Systems Review of Systems   has a past medical history of Asthma.   No past surgical history on file.  Family History  Problem Relation Age of Onset  . Migraines Mother   . Diabetes Maternal Grandmother   . Heart failure Maternal Grandfather   . Arthritis Other        surgery    Social History Social History   Tobacco Use  . Smoking status: Never Smoker  . Smokeless tobacco: Never Used  Substance Use Topics  . Alcohol use: No    Alcohol/week: 0.0 standard drinks  . Drug use: No    Allergies  Allergen Reactions  . Amoxicillin Hives  . Bee Venom     Has EPIPEN    Current Outpatient Medications  Medication Sig Dispense Refill  . albuterol (PROVENTIL HFA;VENTOLIN HFA) 108 (90 Base) MCG/ACT inhaler Inhale 1-2 puffs into the lungs every 6 (six) hours as needed for wheezing or shortness of breath. (Patient not taking: Reported on 08/03/2017) 1 Inhaler 0  . antipyrine-benzocaine (AURALGAN) OTIC solution Place 3-4 drops into the right ear every 2 (two) hours as needed for ear pain. (Patient not taking: Reported on 10/15/2019) 10 mL 0  . azithromycin (ZITHROMAX) 250 MG tablet Take first 2 tablets together, then 1 every day until finished. (Patient not taking: Reported on 10/15/2019) 6 tablet 0  . brompheniramine-pseudoephedrine-DM 30-2-10 MG/5ML syrup Take 5 mLs 4 (four) times daily as needed by mouth. (Patient not taking: Reported on 10/15/2019) 120 mL 0  . clindamycin (CLEOCIN) 150 MG capsule Take 2 capsules (300 mg total) by mouth 4 (four) times daily. (Patient not taking: Reported on 10/15/2019)  56 capsule 0  . dicyclomine (BENTYL) 20 MG tablet Take 1 tablet (20 mg total) by mouth 3 (three) times daily as needed for spasms. (Patient not taking: Reported on 10/15/2019) 20 tablet 0  . docusate sodium (COLACE) 100 MG capsule Take 1 capsule (100 mg total) by mouth 2 (two) times daily. (Patient not taking: Reported on 03/08/2016) 60 capsule 1  . EPINEPHrine (EPIPEN 2-PAK) 0.3 mg/0.3 mL IJ SOAJ injection Inject 0.3 mg into the muscle once.    . fluticasone (FLONASE) 50 MCG/ACT nasal spray Place 2 sprays into both nostrils daily. (Patient not taking: Reported on 08/03/2017) 16 g 0  . HYDROcodone-acetaminophen (NORCO/VICODIN) 5-325 MG tablet Take 1 tablet by mouth every 4 (four) hours as needed. 10 tablet 0  . hydrOXYzine (ATARAX/VISTARIL) 25 MG tablet Take 25 mg by mouth every 8 (eight) hours as needed.    . loperamide (IMODIUM A-D) 2 MG tablet Take 1 tablet (2 mg total) by mouth 4 (four) times daily as needed for diarrhea or loose stools. (Patient not taking: Reported on 10/15/2019) 30 tablet 0  . Nutritional Supplements (ENSURE HEALTHY MOM) LIQD Take 1 Bottle by mouth 2 (two) times daily at 8 am and 10 pm. (Patient not taking: Reported on 06/24/2016) 12 Bottle 3  . phenazopyridine (PYRIDIUM) 200 MG tablet Take 1 tablet (200 mg total) by mouth 3 (three) times daily. (Patient not taking: Reported  on 10/15/2019) 6 tablet 0  . predniSONE (STERAPRED UNI-PAK 21 TAB) 10 MG (21) TBPK tablet Take 6 tabs by mouth daily  for 2 days, then 5 tabs for 2 days, then 4 tabs for 2 days, then 3 tabs for 2 days, 2 tabs for 2 days, then 1 tab by mouth daily for 2 days 42 tablet 0  . Prenat-FeFum-FePo-FA-Omega 3 (CONCEPT DHA) 53.5-38-1 MG CAPS Take 1 tablet by mouth daily. (Patient not taking: Reported on 06/24/2016) 30 capsule 11  . prochlorperazine (COMPAZINE) 10 MG tablet Take 1 tablet (10 mg total) by mouth every 6 (six) hours as needed for nausea or vomiting. (Patient not taking: Reported on 10/15/2019) 12 tablet 0  .  traMADol (ULTRAM) 50 MG tablet Take 1 tablet (50 mg total) by mouth every 6 (six) hours as needed. (Patient not taking: Reported on 10/15/2019) 10 tablet 0   No current facility-administered medications for this visit.    Physical Exam There were no vitals taken for this visit. There is no height or weight on file to calculate BMI.  Well developed and well nourished  Stands with normal weight bearing line *** Alert and oriented x 3  Normal affect and mood  Ortho Exam   Data Reviewed  Emergency room records were reviewed I have copied some of the information into the medical record related to this injury   Julie Lawson is a 29 y.o. female with medical history significant for asthma who presents for evaluation of sacral pain.  Patient states on Wednesday, 4 days PTA she fell down approximately 5 steps landing on her bottom.  She denies hitting her head, LOC or anticoagulation.  Patient states she slipped and fell as she was trying to carry a computer.  Has been ambulatory since the incident however she notes she has difficulty sitting on her bottom secondary to pain.  She denies any pain with bowel movements, overlying skin changes, radiation of pain to extremities, IV drug use, bowel or bladder incontinence, saddle paresthesia, headache, lightheadedness, dizziness, decreased range of motion to extremities.  She has been taking Tylenol without relief of her symptoms.  She rates her current pain a 7/10.  Denies additional aggravating or alleviating factors.    IMAGING From THE ER AND THE REPORT ARE REVIEWED, MY INTERPRETATION OF THE IMAGE(S) IS : Lumbar spine 5 views no fracture dislocation or disc space narrowing is seen in the lumbar region  Sacral films coccyx fracture minimal displacement  Assessment  Coccyx fracture  Plan   Supportive measures including adequate medication for pain relief  Follow-up 6 weeks   Arther Abbott, MD 12/06/2019 8:19 AM

## 2020-04-27 ENCOUNTER — Ambulatory Visit
Admission: EM | Admit: 2020-04-27 | Discharge: 2020-04-27 | Disposition: A | Discharge status: Home / Self Care | Payer: Medicaid Other | Attending: Emergency Medicine | Admitting: Emergency Medicine

## 2020-04-27 ENCOUNTER — Encounter: Payer: Self-pay | Admitting: Emergency Medicine

## 2020-04-27 DIAGNOSIS — L239 Allergic contact dermatitis, unspecified cause: Secondary | ICD-10-CM | POA: Diagnosis not present

## 2020-04-27 MED ORDER — PREDNISONE 10 MG (21) PO TBPK: ORAL_TABLET | ORAL | 0 refills | Status: AC

## 2020-04-27 MED ORDER — TRIAMCINOLONE ACETONIDE 0.1 % EX CREA: 1.0000 "application " | TOPICAL_CREAM | Freq: Two times a day (BID) | CUTANEOUS | 0 refills | Status: AC

## 2020-04-27 NOTE — ED Triage Notes (Signed)
Pt has red rash that is itchy since Friday.  Has tried zyrtec and aveno oatmeal bath with no relief.  Pt has had this rash before and seen a dermatologist.  They just recommended she keep taking steroids she was prescribed at the ED.  Pt started clarithromycin x 2 weeks ago and is concerned this may be what is causing the rash.

## 2020-04-27 NOTE — ED Provider Notes (Signed)
[facility]    CSN: [id number] Arrival date & time: 04/27/20  1513      History   Chief Complaint Chief Complaint  Patient presents with  . Rash    HPI Julie Lawson is a 29 y.o. female.   Who presented to the urgent care for complaint of rash for the past few days..  She denies changes in soaps, detergents, or anyone with similar symptoms.  She localizes the rash to her upper arm, and leg.  She describes it as painful itchy red and spreading.  She has tried OTC Zyrtec without relief.  Nothing make her symptoms worse.    Denies similar symptoms in the past.  Denies chills, fever, nausea, vomiting, diarrhea  The history is provided by the patient. No language interpreter was used.  Rash   Past Medical History:  Diagnosis Date  . Asthma    no history of exacerbations since childhood.    Patient Active Problem List   Diagnosis Date Noted  . Labor and delivery, indication for care 06/25/2016  . Poor weight gain of pregnancy 04/12/2016  . Supervision of normal pregnancy in second trimester 04/12/2016  . Constipation in pregnancy in first trimester 02/15/2016  . GBS bacteriuria 12/19/2015  . Rubella non-immune status, antepartum 12/17/2015  . Hemorrhoid 04/14/2014  . 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 TUBAL LIGATION      OB History    Gravida  3   Para  2   Term  2   Preterm      AB      Living  2     SAB      TAB      Ectopic      Multiple      Live Births  2            Home Medications    Prior to Admission medications   Medication Sig Start Date End Date Taking? Authorizing Provider  antipyrine-benzocaine [name]) OTIC solution Place 3-4 drops into the right ear every 2 (two) hours as needed for ear pain. 01/07/18  Yes Triplett, Tammy, PA-C  azithromycin (ZITHROMAX) 250 MG tablet Take first 2 tablets together, then 1 every day until finished. 01/07/18  Yes Triplett,  Tammy, PA-C  brompheniramine-pseudoephedrine-DM 30-2-10 MG/5ML syrup Take 5 mLs 4 (four) times daily as needed by mouth. 09/20/17  Yes Triplett, Cari B, FNP  clindamycin (CLEOCIN) 150 MG capsule Take 2 capsules (300 mg total) by mouth 4 (four) times daily. 10/04/18  Yes Triplett, Tammy, PA-C  dicyclomine (BENTYL) 20 MG tablet Take 1 tablet (20 mg total) by mouth 3 (three) times daily as needed for spasms. 08/03/17  Yes Emily Filbert, MD  docusate sodium (COLACE) 100 MG capsule Take 1 capsule (100 mg total) by mouth 2 (two) times daily. 12/16/15  Yes Shambley, Melody N, CNM  fluticasone (FLONASE) 50 MCG/ACT nasal spray Place 2 sprays into both nostrils daily. 07/20/16  Yes Hagler, Jami L, PA-C  HYDROcodone-acetaminophen (NORCO/VICODIN) 5-325 MG tablet Take 1 tablet by mouth every 4 (four) hours as needed. 11/24/19  Yes Henderly, Britni A, PA-C  hydrOXYzine (ATARAX/VISTARIL) 25 MG tablet Take 25 mg by mouth every 8 (eight) hours as needed. 10/02/19  Yes [provider]  loperamide (IMODIUM A-D) 2 MG tablet Take 1 tablet (2 mg total) by mouth 4 (four) times daily as needed for diarrhea or loose stools. 08/03/17  Yes Daryel November  E, MD  Nutritional Supplements (ENSURE HEALTHY MOM) LIQD Take 1 Bottle by mouth 2 (two) times daily at 8 am and 10 pm. 04/12/16  Yes Hildred Laser, MD  phenazopyridine (PYRIDIUM) 200 MG tablet Take 1 tablet (200 mg total) by mouth 3 (three) times daily. 11/19/17  Yes Liberty Handy, PA-C  Prenat-FeFum-FePo-FA-Omega 3 (CONCEPT DHA) 53.5-38-1 MG CAPS Take 1 tablet by mouth daily. 12/16/15  Yes Shambley, Melody N, CNM  prochlorperazine (COMPAZINE) 10 MG tablet Take 1 tablet (10 mg total) by mouth every 6 (six) hours as needed for nausea or vomiting. 06/04/18  Yes Willy Eddy, MD  traMADol (ULTRAM) 50 MG tablet Take 1 tablet (50 mg total) by mouth every 6 (six) hours as needed. 10/04/18  Yes Triplett, Tammy, PA-C  albuterol (PROVENTIL HFA;VENTOLIN HFA) 108 (90 Base)  MCG/ACT inhaler Inhale 1-2 puffs into the lungs every 6 (six) hours as needed for wheezing or shortness of breath. Patient not taking: Reported on 08/03/2017 07/20/16   Hagler, Jami L, PA-C  EPINEPHrine (EPIPEN 2-PAK) 0.3 mg/0.3 mL IJ SOAJ injection Inject 0.3 mg into the muscle once.    [provider]  predniSONE (STERAPRED UNI-PAK 21 TAB) 10 MG (21) TBPK tablet Take 6 tabs by mouth daily  for 1 day, then 5 tabs for 1 day, then 4 tabs for 1 day, then 3 tabs for 1 day, 2 tabs for 1 day, then 1 tab by mouth daily for 1 day 04/27/20   Zohal Reny, Zachery Dakins, FNP  triamcinolone cream (KENALOG) 0.1 % Apply 1 application topically 2 (two) times daily. 04/27/20   Oluwatimilehin Balfour, Zachery Dakins, FNP    Family History Family History  Problem Relation Age of Onset  . Migraines Mother   . Diabetes Maternal Grandmother   . Heart failure Maternal Grandfather   . Arthritis Other        surgery    Social History Social History   Tobacco Use  . Smoking status: Never Smoker  . Smokeless tobacco: Never Used  Vaping Use  . Vaping Use: Never used  Substance Use Topics  . Alcohol use: No    Alcohol/week: 0.0 standard drinks    Comment: occ  . Drug use: No     Allergies   Amoxicillin and Bee venom   Review of Systems Review of Systems  Constitutional: Negative.   Respiratory: Negative.   Cardiovascular: Negative.   Skin: Positive for color change and rash.  All other systems reviewed and are negative.    Physical Exam Triage Vital Signs ED Triage Vitals  Enc Vitals Group     BP 04/27/20 1530 104/71     Pulse Rate 04/27/20 1530 94     Resp 04/27/20 1530 18     Temp 04/27/20 1530 98.1 F (36.7 C)     Temp Source 04/27/20 1530 Oral     SpO2 04/27/20 1530 98 %     Weight 04/27/20 1531 110 lb (49.9 kg)     Height 04/27/20 1531 5\' 2"  (1.575 m)     Head Circumference --      Peak Flow --      Pain Score 04/27/20 1530 0     Pain Loc --      Pain Edu? --      Excl. in GC? --    No data  found.  Updated Vital Signs BP 104/71 (BP Location: Right Arm)   Pulse 94   Temp 98.1 F (36.7 C) (Oral)   Resp 18  Ht 5\' 2"  (1.575 m)   Wt 110 lb (49.9 kg)   SpO2 98%   BMI 20.12 kg/m   Visual Acuity Right Eye Distance:   Left Eye Distance:   Bilateral Distance:    Right Eye Near:   Left Eye Near:    Bilateral Near:     Physical Exam Vitals and nursing note reviewed.  Constitutional:      General: She is not in acute distress.    Appearance: Normal appearance. She is normal weight. She is not ill-appearing, toxic-appearing or diaphoretic.  Cardiovascular:     Rate and Rhythm: Normal rate and regular rhythm.     Pulses: Normal pulses.     Heart sounds: Normal heart sounds. No murmur heard.  No friction rub. No gallop.   Pulmonary:     Effort: Pulmonary effort is normal. No respiratory distress.     Breath sounds: Normal breath sounds. No stridor. No wheezing, rhonchi or rales.  Chest:     Chest wall: No tenderness.  Skin:    Findings: Rash present. Rash is macular.  Neurological:     Mental Status: She is alert.      UC Treatments / Results  Labs (all labs ordered are listed, but only abnormal results are displayed) Labs Reviewed - No data to display  EKG   Radiology No results found.  Procedures Procedures (including critical care time)  Medications Ordered in UC Medications - No data to display  Initial Impression / Assessment and Plan / UC Course  I have reviewed the triage vital signs and the nursing notes.  Pertinent labs & imaging results that were available during my care of the patient were reviewed by me and considered in my medical decision making (see chart for details).   Patient is stable at discharge.  Symptom is likely from allergic dermatitis.  Prednisone and triamcinolone cream were prescribed.  Was advised for PCP.  Final Clinical Impressions(s) / UC Diagnoses   Final diagnoses:  Allergic dermatitis     Discharge  Instructions     Prescribed prednisone and triamcinolone cream Continue to take Zyrtec as prescribed Take as prescribed and to completion Limit hot shower and baths, or bathe with warm water.   Moisturize skin daily Follow up with PCP if symptoms persists Return or go to the ER if you have any new or worsening symptoms     ED Prescriptions    Medication Sig Dispense Auth. Provider   predniSONE (STERAPRED UNI-PAK 21 TAB) 10 MG (21) TBPK tablet Take 6 tabs by mouth daily  for 1 day, then 5 tabs for 1 day, then 4 tabs for 1 day, then 3 tabs for 1 day, 2 tabs for 1 day, then 1 tab by mouth daily for 1 day 21 tablet Evelette Hollern, Darrelyn Hillock, FNP   triamcinolone cream (KENALOG) 0.1 % Apply 1 application topically 2 (two) times daily. 30 g Emerson Monte, FNP     PDMP not reviewed this encounter.   Emerson Monte, [place] 04/27/20 1605

## 2020-04-27 NOTE — Discharge Instructions (Addendum)
Prescribed prednisone and triamcinolone cream Continue to take Zyrtec as prescribed Take as prescribed and to completion Limit hot shower and baths, or bathe with warm water.   Moisturize skin daily Follow up with PCP if symptoms persists Return or go to the ER if you have any new or worsening symptoms

## 2021-03-22 ENCOUNTER — Encounter: Payer: Self-pay | Admitting: Emergency Medicine

## 2021-03-22 ENCOUNTER — Ambulatory Visit
Admission: EM | Admit: 2021-03-22 | Discharge: 2021-03-22 | Disposition: A | Discharge status: Home / Self Care | Payer: Medicaid Other | Attending: Family Medicine | Admitting: Family Medicine

## 2021-03-22 ENCOUNTER — Other Ambulatory Visit: Payer: Self-pay

## 2021-03-22 DIAGNOSIS — J069 Acute upper respiratory infection, unspecified: Secondary | ICD-10-CM

## 2021-03-22 NOTE — ED Provider Notes (Signed)
[facility]    CSN: [id number] Arrival date & time: 03/22/21  1436      History   Chief Complaint Chief Complaint  Patient presents with  . Otalgia    HPI Julie Lawson is a 30 y.o. female.   Reports 2 days of nasal congestion, post nasal drip, rhinorrhea, cough. Reports that daughter is sick as well with the same symptoms. Has used zyrtec at home with little relief. Denies other sick contacts. Has negative hx Covid. Has completed Covid vaccines, no booster. Has completed flu vaccine this year. Denies headache, abdominal pain, nausea, vomiting, diarrhea, rash, fever, other symptoms.  ROS per HPI  The history is provided by the patient.    Past Medical History:  Diagnosis Date  . Asthma    no history of exacerbations since childhood.    Patient Active Problem List   Diagnosis Date Noted  . Labor and delivery, indication for care 06/25/2016  . Poor weight gain of pregnancy 04/12/2016  . Supervision of normal pregnancy in second trimester 04/12/2016  . Constipation in pregnancy in first trimester 02/15/2016  . GBS bacteriuria 12/19/2015  . Rubella non-immune status, antepartum 12/17/2015  . Hemorrhoid 04/14/2014  . 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 TUBAL LIGATION      OB History    Gravida  3   Para  2   Term  2   Preterm      AB      Living  2     SAB      IAB      Ectopic      Multiple      Live Births  2            Home Medications    Prior to Admission medications   Medication Sig Start Date End Date Taking? Authorizing Provider  albuterol (PROVENTIL HFA;VENTOLIN HFA) 108 (90 Base) MCG/ACT inhaler Inhale 1-2 puffs into the lungs every 6 (six) hours as needed for wheezing or shortness of breath. Patient not taking: Reported on 08/03/2017 07/20/16   Hagler, Jami L, PA-C  antipyrine-benzocaine [name](AURALGAN) OTIC solution Place 3-4 drops into the right ear every 2 (two)  hours as needed for ear pain. 01/07/18   Triplett, Tammy, PA-C  azithromycin (ZITHROMAX) 250 MG tablet Take first 2 tablets together, then 1 every day until finished. 01/07/18   Triplett, Tammy, PA-C  brompheniramine-pseudoephedrine-DM 30-2-10 MG/5ML syrup Take 5 mLs 4 (four) times daily as needed by mouth. 09/20/17   Triplett, Rulon Eisenmengerari B, FNP  clindamycin (CLEOCIN) 150 MG capsule Take 2 capsules (300 mg total) by mouth 4 (four) times daily. 10/04/18   Triplett, Tammy, PA-C  dicyclomine (BENTYL) 20 MG tablet Take 1 tablet (20 mg total) by mouth 3 (three) times daily as needed for spasms. 08/03/17   Emily FilbertWilliams, Jonathan E, MD  docusate sodium (COLACE) 100 MG capsule Take 1 capsule (100 mg total) by mouth 2 (two) times daily. 12/16/15   Shambley, Melody N, CNM  EPINEPHrine (EPIPEN 2-PAK) 0.3 mg/0.3 mL IJ SOAJ injection Inject 0.3 mg into the muscle once.    [provider]  fluticasone (FLONASE) 50 MCG/ACT nasal spray Place 2 sprays into both nostrils daily. 07/20/16   Hagler, Jami L, PA-C  HYDROcodone-acetaminophen (NORCO/VICODIN) 5-325 MG tablet Take 1 tablet by mouth every 4 (four) hours as needed. 11/24/19   Henderly, Britni A, PA-C  hydrOXYzine (ATARAX/VISTARIL) 25 MG tablet Take 25 mg  by mouth every 8 (eight) hours as needed. 10/02/19   [provider]  loperamide (IMODIUM A-D) 2 MG tablet Take 1 tablet (2 mg total) by mouth 4 (four) times daily as needed for diarrhea or loose stools. 08/03/17   Emily Filbert, MD  Nutritional Supplements (ENSURE HEALTHY MOM) LIQD Take 1 Bottle by mouth 2 (two) times daily at 8 am and 10 pm. 04/12/16   Hildred Laser, MD  phenazopyridine (PYRIDIUM) 200 MG tablet Take 1 tablet (200 mg total) by mouth 3 (three) times daily. 11/19/17   Liberty Handy, PA-C  predniSONE (STERAPRED UNI-PAK 21 TAB) 10 MG (21) TBPK tablet Take 6 tabs by mouth daily  for 1 day, then 5 tabs for 1 day, then 4 tabs for 1 day, then 3 tabs for 1 day, 2 tabs for 1 day, then 1 tab by mouth  daily for 1 day 04/27/20   Avegno, Zachery Dakins, FNP  Prenat-FeFum-FePo-FA-Omega 3 (CONCEPT DHA) 53.5-38-1 MG CAPS Take 1 tablet by mouth daily. 12/16/15   Shambley, Melody N, CNM  prochlorperazine (COMPAZINE) 10 MG tablet Take 1 tablet (10 mg total) by mouth every 6 (six) hours as needed for nausea or vomiting. 06/04/18   Willy Eddy, MD  traMADol (ULTRAM) 50 MG tablet Take 1 tablet (50 mg total) by mouth every 6 (six) hours as needed. 10/04/18   Triplett, Tammy, PA-C  triamcinolone cream (KENALOG) 0.1 % Apply 1 application topically 2 (two) times daily. 04/27/20   Avegno, Zachery Dakins, FNP    Family History Family History  Problem Relation Age of Onset  . Migraines Mother   . Diabetes Maternal Grandmother   . Heart failure Maternal Grandfather   . Arthritis Other        surgery    Social History Social History   Tobacco Use  . Smoking status: Never Smoker  . Smokeless tobacco: Never Used  Vaping Use  . Vaping Use: Never used  Substance Use Topics  . Alcohol use: No    Alcohol/week: 0.0 standard drinks    Comment: occ  . Drug use: No     Allergies   Amoxicillin and Bee venom   Review of Systems Review of Systems   Physical Exam Triage Vital Signs ED Triage Vitals  Enc Vitals Group     BP 03/22/21 1513 102/69     Pulse Rate 03/22/21 1513 93     Resp 03/22/21 1513 17     Temp 03/22/21 1513 97.8 F (36.6 C)     Temp Source 03/22/21 1513 Temporal     SpO2 03/22/21 1513 98 %     Weight --      Height --      Head Circumference --      Peak Flow --      Pain Score 03/22/21 1515 0     Pain Loc --      Pain Edu? --      Excl. in GC? --    No data found.  Updated Vital Signs BP 102/69 (BP Location: Right Arm)   Pulse 93   Temp 97.8 F (36.6 C) (Temporal)   Resp 17   SpO2 98%       Physical Exam Vitals and nursing note reviewed.  Constitutional:      General: She is not in acute distress.    Appearance: Normal appearance. She is well-developed and  normal weight. She is ill-appearing. She is not toxic-appearing or diaphoretic.  HENT:  Head: Normocephalic and atraumatic.     Right Ear: Tympanic membrane, ear canal and external ear normal.     Left Ear: Tympanic membrane, ear canal and external ear normal.     Nose: Congestion and rhinorrhea present.     Comments: Clear     Mouth/Throat:     Mouth: Mucous membranes are moist.     Pharynx: Oropharynx is clear. Posterior oropharyngeal erythema present.  Eyes:     Extraocular Movements: Extraocular movements intact.     Conjunctiva/sclera: Conjunctivae normal.     Pupils: Pupils are equal, round, and reactive to light.  Cardiovascular:     Rate and Rhythm: Normal rate and regular rhythm.     Heart sounds: Normal heart sounds. No murmur heard.   Pulmonary:     Effort: Pulmonary effort is normal. No respiratory distress.     Breath sounds: Normal breath sounds. No stridor. No wheezing, rhonchi or rales.  Chest:     Chest wall: No tenderness.  Abdominal:     General: Bowel sounds are normal. There is no distension.     Palpations: Abdomen is soft. There is no mass.     Tenderness: There is no abdominal tenderness. There is no right CVA tenderness, left CVA tenderness, guarding or rebound.     Hernia: No hernia is present.  Musculoskeletal:        General: Normal range of motion.     Cervical back: Normal range of motion and neck supple.  Lymphadenopathy:     Cervical: Cervical adenopathy present.  Skin:    General: Skin is warm and dry.     Capillary Refill: Capillary refill takes less than 2 seconds.  Neurological:     General: No focal deficit present.     Mental Status: She is alert and oriented to person, place, and time.  Psychiatric:        Mood and Affect: Mood normal.        Behavior: Behavior normal.        Thought Content: Thought content normal.      UC Treatments / Results  Labs (all labs ordered are listed, but only abnormal results are  displayed) Labs Reviewed  COVID-19, FLU A+B NAA    EKG   Radiology No results found.  Procedures Procedures (including critical care time)  Medications Ordered in UC Medications - No data to display  Initial Impression / Assessment and Plan / UC Course  I have reviewed the triage vital signs and the nursing notes.  Pertinent labs & imaging results that were available during my care of the patient were reviewed by me and considered in my medical decision making (see chart for details).    Viral URI with cough  Push fluids and get rest. Work note provided Covid and flu swab obtained in office today.   Patient instructed to quarantine until results are back and negative.   If results are negative, patient may resume daily schedule as tolerated once they are fever free for 24 hours without the use of antipyretic medications.   If results are positive, patient instructed to quarantine for at least 5 days from symptom onset.  If after 5 days symptoms have resolved, may return to work with a well fitting mask for the next 5 days. If symptomatic after day 5, isolation should be extended to 10 days. Patient instructed to follow-up with primary care or with this office as needed.   Patient instructed to follow-up in the ER  for trouble swallowing, trouble breathing, other concerning symptoms.   Final Clinical Impressions(s) / UC Diagnoses   Final diagnoses:  Viral URI with cough     Discharge Instructions     Your COVID and Influenza tests are pending.  You should self quarantine until the test results are back.    Take Tylenol or ibuprofen as needed for fever or discomfort.  Rest and keep yourself hydrated.    Follow-up with your primary care provider if your symptoms are not improving.        ED Prescriptions    None     PDMP not reviewed this encounter.   Moshe Cipro, NP 03/22/21 [phone]

## 2021-03-22 NOTE — ED Triage Notes (Signed)
LT ear feels stopped up and popping , having some blood when blowing her nose x 2days

## 2021-03-22 NOTE — Discharge Instructions (Signed)
Your COVID and Influenza tests are pending.  You should self quarantine until the test results are back.    Take Tylenol or ibuprofen as needed for fever or discomfort.  Rest and keep yourself hydrated.    Follow-up with your primary care provider if your symptoms are not improving.     

## 2021-03-23 LAB — COVID-19, FLU A+B NAA
Influenza A, NAA: NOT DETECTED
Influenza B, NAA: NOT DETECTED
SARS-CoV-2, NAA: NOT DETECTED

## 2022-02-17 ENCOUNTER — Other Ambulatory Visit: Payer: Self-pay | Admitting: Physician Assistant

## 2022-02-17 DIAGNOSIS — M25562 Pain in left knee: Secondary | ICD-10-CM

## 2022-03-18 ENCOUNTER — Ambulatory Visit
Admission: RE | Admit: 2022-03-18 | Discharge: 2022-03-18 | Disposition: A | Discharge status: Home / Self Care | Payer: Medicaid Other | Source: Ambulatory Visit | Attending: Physician Assistant | Admitting: Physician Assistant

## 2022-03-18 ENCOUNTER — Other Ambulatory Visit: Payer: Self-pay | Admitting: Physician Assistant

## 2022-03-18 DIAGNOSIS — M25562 Pain in left knee: Secondary | ICD-10-CM

## 2022-03-18 MED ORDER — IOPAMIDOL (ISOVUE-M 200) INJECTION 41%
40.0000 mL | Freq: Once | INTRAMUSCULAR | Status: AC
  Administered 2022-03-18: 40 mL via INTRA_ARTICULAR

## 2022-06-28 ENCOUNTER — Other Ambulatory Visit: Payer: Self-pay

## 2022-06-28 ENCOUNTER — Encounter (HOSPITAL_COMMUNITY): Payer: Self-pay

## 2022-06-28 ENCOUNTER — Emergency Department (HOSPITAL_COMMUNITY): Payer: Medicaid Other

## 2022-06-28 ENCOUNTER — Emergency Department (HOSPITAL_COMMUNITY)
Admission: EM | Admit: 2022-06-28 | Discharge: 2022-06-29 | Disposition: A | Discharge status: Home / Self Care | Payer: Medicaid Other | Attending: Emergency Medicine | Admitting: Emergency Medicine

## 2022-06-28 DIAGNOSIS — R519 Headache, unspecified: Secondary | ICD-10-CM | POA: Insufficient documentation

## 2022-06-28 DIAGNOSIS — R42 Dizziness and giddiness: Secondary | ICD-10-CM | POA: Insufficient documentation

## 2022-06-28 DIAGNOSIS — J45909 Unspecified asthma, uncomplicated: Secondary | ICD-10-CM | POA: Insufficient documentation

## 2022-06-28 DIAGNOSIS — Z7951 Long term (current) use of inhaled steroids: Secondary | ICD-10-CM | POA: Diagnosis not present

## 2022-06-28 LAB — URINALYSIS, ROUTINE W REFLEX MICROSCOPIC
Bilirubin Urine: NEGATIVE
Glucose, UA: NEGATIVE mg/dL
Ketones, ur: 5 mg/dL — AB
Nitrite: NEGATIVE
Protein, ur: NEGATIVE mg/dL
Specific Gravity, Urine: 1.019 (ref 1.005–1.030)
pH: 6 (ref 5.0–8.0)

## 2022-06-28 MED ORDER — DEXAMETHASONE SODIUM PHOSPHATE 10 MG/ML IJ SOLN: 10.0000 mg | Freq: Once | INTRAMUSCULAR | Status: AC

## 2022-06-28 MED ORDER — KETOROLAC TROMETHAMINE 30 MG/ML IJ SOLN: 30.0000 mg | Freq: Once | INTRAMUSCULAR | Status: AC

## 2022-06-28 MED ORDER — SODIUM CHLORIDE 0.9 % IV BOLUS
1000.0000 mL | Freq: Once | INTRAVENOUS | Status: AC
  Administered 2022-06-29: 1000 mL via INTRAVENOUS

## 2022-06-28 MED ORDER — METOCLOPRAMIDE HCL 5 MG/ML IJ SOLN: 10.0000 mg | Freq: Once | INTRAMUSCULAR | Status: AC

## 2022-06-28 MED ORDER — DIPHENHYDRAMINE HCL 50 MG/ML IJ SOLN: 25.0000 mg | Freq: Once | INTRAMUSCULAR | Status: AC

## 2022-06-28 NOTE — ED Triage Notes (Signed)
POV from home. Cc of migraine for 3 days. Makes her feel nauseated Also concerns for pregnancy- states her tubes are tied but she has not had a regular period in 2 months.

## 2022-06-29 LAB — PREGNANCY, URINE: Preg Test, Ur: NEGATIVE

## 2022-06-29 MED ORDER — KETOROLAC TROMETHAMINE 30 MG/ML IJ SOLN
INTRAMUSCULAR | Status: AC
  Administered 2022-06-29: 30 mg via INTRAVENOUS
  Filled 2022-06-29: qty 1

## 2022-06-29 MED ORDER — DEXAMETHASONE SODIUM PHOSPHATE 10 MG/ML IJ SOLN
INTRAMUSCULAR | Status: AC
  Administered 2022-06-29: 10 mg via INTRAVENOUS
  Filled 2022-06-29: qty 1

## 2022-06-29 MED ORDER — DIPHENHYDRAMINE HCL 50 MG/ML IJ SOLN
INTRAMUSCULAR | Status: AC
  Administered 2022-06-29: 25 mg via INTRAVENOUS
  Filled 2022-06-29: qty 1

## 2022-06-29 MED ORDER — METOCLOPRAMIDE HCL 5 MG/ML IJ SOLN
INTRAMUSCULAR | Status: AC
  Administered 2022-06-29: 10 mg via INTRAVENOUS
  Filled 2022-06-29: qty 2

## 2022-06-29 NOTE — ED Provider Notes (Signed)
[facility] EMERGENCY DEPARTMENT Provider Note   CSN: [id number] Arrival date & time: 06/28/22  1850     History  Chief Complaint  Patient presents with   Migraine    Julie Lawson is a 31 y.o. female.  Patient is a 31 year old female with past medical history of asthma and prior tubal ligation.  Patient presenting today for evaluation of headache.  She describes a 3-day history of bitemporal pain with associated dizziness and feeling generally unwell.  She denies any weakness or numbness.  She denies any fevers or chills.  She denies stiff neck.  She has been taking Tylenol and ibuprofen at home with no relief.  The history is provided by the patient.       Home Medications Prior to Admission medications   Medication Sig Start Date End Date Taking? Authorizing Provider  albuterol (PROVENTIL HFA;VENTOLIN HFA) 108 (90 Base) MCG/ACT inhaler Inhale 1-2 puffs into the lungs every 6 (six) hours as needed for wheezing or shortness of breath. Patient not taking: Reported on 08/03/2017 07/20/16   Hagler, Jami L, PA-C  antipyrine-benzocaine [name]) OTIC solution Place 3-4 drops into the right ear every 2 (two) hours as needed for ear pain. 01/07/18   Triplett, Tammy, PA-C  azithromycin (ZITHROMAX) 250 MG tablet Take first 2 tablets together, then 1 every day until finished. 01/07/18   Triplett, Tammy, PA-C  brompheniramine-pseudoephedrine-DM 30-2-10 MG/5ML syrup Take 5 mLs 4 (four) times daily as needed by mouth. 09/20/17   Triplett, Rulon Eisenmenger B, FNP  clindamycin (CLEOCIN) 150 MG capsule Take 2 capsules (300 mg total) by mouth 4 (four) times daily. 10/04/18   Triplett, Tammy, PA-C  dicyclomine (BENTYL) 20 MG tablet Take 1 tablet (20 mg total) by mouth 3 (three) times daily as needed for spasms. 08/03/17   Emily Filbert, MD  docusate sodium (COLACE) 100 MG capsule Take 1 capsule (100 mg total) by mouth 2 (two) times daily. 12/16/15   Shambley, Melody N, CNM  EPINEPHrine (EPIPEN 2-PAK) 0.3  mg/0.3 mL IJ SOAJ injection Inject 0.3 mg into the muscle once.    [provider]  fluticasone (FLONASE) 50 MCG/ACT nasal spray Place 2 sprays into both nostrils daily. 07/20/16   Hagler, Jami L, PA-C  HYDROcodone-acetaminophen (NORCO/VICODIN) 5-325 MG tablet Take 1 tablet by mouth every 4 (four) hours as needed. 11/24/19   Henderly, Britni A, PA-C  hydrOXYzine (ATARAX/VISTARIL) 25 MG tablet Take 25 mg by mouth every 8 (eight) hours as needed. 10/02/19   [provider]  loperamide (IMODIUM A-D) 2 MG tablet Take 1 tablet (2 mg total) by mouth 4 (four) times daily as needed for diarrhea or loose stools. 08/03/17   Emily Filbert, MD  Nutritional Supplements (ENSURE HEALTHY MOM) LIQD Take 1 Bottle by mouth 2 (two) times daily at 8 am and 10 pm. 04/12/16   Hildred Laser, MD  phenazopyridine (PYRIDIUM) 200 MG tablet Take 1 tablet (200 mg total) by mouth 3 (three) times daily. 11/19/17   Liberty Handy, PA-C  predniSONE (STERAPRED UNI-PAK 21 TAB) 10 MG (21) TBPK tablet Take 6 tabs by mouth daily  for 1 day, then 5 tabs for 1 day, then 4 tabs for 1 day, then 3 tabs for 1 day, 2 tabs for 1 day, then 1 tab by mouth daily for 1 day 04/27/20   Avegno, Zachery Dakins, FNP  Prenat-FeFum-FePo-FA-Omega 3 (CONCEPT DHA) 53.5-38-1 MG CAPS Take 1 tablet by mouth daily. 12/16/15   Shambley, Melody N, CNM  prochlorperazine (  COMPAZINE) 10 MG tablet Take 1 tablet (10 mg total) by mouth every 6 (six) hours as needed for nausea or vomiting. 06/04/18   Willy Eddy, MD  traMADol (ULTRAM) 50 MG tablet Take 1 tablet (50 mg total) by mouth every 6 (six) hours as needed. 10/04/18   Triplett, Tammy, PA-C  triamcinolone cream (KENALOG) 0.1 % Apply 1 application topically 2 (two) times daily. 04/27/20   Avegno, Zachery Dakins, FNP      Allergies    Amoxicillin and Bee venom    Review of Systems   Review of Systems  All other systems reviewed and are negative.   Physical Exam Updated Vital Signs BP 114/81    Pulse 64   Temp 98.2 F (36.8 C) (Oral)   Resp 15   Ht 5\' 2"  (1.575 m)   Wt 52.2 kg   SpO2 100%   BMI 21.03 kg/m  Physical Exam Vitals and nursing note reviewed.  Constitutional:      General: She is not in acute distress.    Appearance: She is well-developed. She is not diaphoretic.  HENT:     Head: Normocephalic and atraumatic.  Eyes:     Extraocular Movements: Extraocular movements intact.     Pupils: Pupils are equal, round, and reactive to light.  Cardiovascular:     Rate and Rhythm: Normal rate and regular rhythm.     Heart sounds: No murmur heard.    No friction rub. No gallop.  Pulmonary:     Effort: Pulmonary effort is normal. No respiratory distress.     Breath sounds: Normal breath sounds. No wheezing.  Abdominal:     General: Bowel sounds are normal. There is no distension.     Palpations: Abdomen is soft.     Tenderness: There is no abdominal tenderness.  Musculoskeletal:        General: Normal range of motion.     Cervical back: Normal range of motion and neck supple.  Skin:    General: Skin is warm and dry.  Neurological:     General: No focal deficit present.     Mental Status: She is alert and oriented to person, place, and time.     Cranial Nerves: No cranial nerve deficit.     Motor: No weakness.     Coordination: Coordination normal.     Gait: Gait normal.     ED Results / Procedures / Treatments   Labs (all labs ordered are listed, but only abnormal results are displayed) Labs Reviewed  URINALYSIS, ROUTINE W REFLEX MICROSCOPIC - Abnormal; Notable for the following components:      Result Value   APPearance HAZY (*)    Hgb urine dipstick SMALL (*)    Ketones, ur 5 (*)    Leukocytes,Ua LARGE (*)    Bacteria, UA RARE (*)    All other components within normal limits  PREGNANCY, URINE    EKG None  Radiology No results found.  Procedures Procedures  {Document cardiac monitor, telemetry assessment procedure when  appropriate:1}  Medications Ordered in ED Medications  sodium chloride 0.9 % bolus 1,000 mL (has no administration in time range)  ketorolac (TORADOL) 30 MG/ML injection 30 mg (has no administration in time range)  dexamethasone (DECADRON) injection 10 mg (has no administration in time range)  diphenhydrAMINE (BENADRYL) injection 25 mg (has no administration in time range)  metoCLOPramide (REGLAN) injection 10 mg (has no administration in time range)    ED Course/ Medical Decision Making/ A&P  Medical Decision Making Amount and/or Complexity of Data Reviewed Labs: ordered. Radiology: ordered.  Risk Prescription drug management.   ***  {Document critical care time when appropriate:1} {Document review of labs and clinical decision tools ie heart score, Chads2Vasc2 etc:1}  {Document your independent review of radiology images, and any outside records:1} {Document your discussion with family members, caretakers, and with consultants:1} {Document social determinants of health affecting pt's care:1} {Document your decision making why or why not admission, treatments were needed:1} Final Clinical Impression(s) / ED Diagnoses Final diagnoses:  None    Rx / DC Orders ED Discharge Orders     None

## 2022-06-29 NOTE — Discharge Instructions (Signed)
Take Tylenol 1000 mg rotated with ibuprofen 600 mg every 4 hours as needed for pain.  Follow-up with your primary doctor if not improving in the next few days.

## 2022-09-29 ENCOUNTER — Emergency Department (HOSPITAL_BASED_OUTPATIENT_CLINIC_OR_DEPARTMENT_OTHER): Payer: 59

## 2022-09-29 ENCOUNTER — Emergency Department (HOSPITAL_BASED_OUTPATIENT_CLINIC_OR_DEPARTMENT_OTHER)
Admission: EM | Admit: 2022-09-29 | Discharge: 2022-09-29 | Disposition: A | Discharge status: Home / Self Care | Payer: 59 | Attending: Emergency Medicine | Admitting: Emergency Medicine

## 2022-09-29 ENCOUNTER — Encounter (HOSPITAL_BASED_OUTPATIENT_CLINIC_OR_DEPARTMENT_OTHER): Payer: Self-pay | Admitting: Emergency Medicine

## 2022-09-29 ENCOUNTER — Other Ambulatory Visit: Payer: Self-pay

## 2022-09-29 DIAGNOSIS — J45909 Unspecified asthma, uncomplicated: Secondary | ICD-10-CM | POA: Insufficient documentation

## 2022-09-29 DIAGNOSIS — R1031 Right lower quadrant pain: Secondary | ICD-10-CM | POA: Diagnosis not present

## 2022-09-29 DIAGNOSIS — N939 Abnormal uterine and vaginal bleeding, unspecified: Secondary | ICD-10-CM | POA: Insufficient documentation

## 2022-09-29 DIAGNOSIS — R102 Pelvic and perineal pain: Secondary | ICD-10-CM | POA: Diagnosis not present

## 2022-09-29 DIAGNOSIS — Z7951 Long term (current) use of inhaled steroids: Secondary | ICD-10-CM | POA: Diagnosis not present

## 2022-09-29 LAB — COMPREHENSIVE METABOLIC PANEL
ALT: 28 U/L (ref 0–44)
AST: 23 U/L (ref 15–41)
Albumin: 4.4 g/dL (ref 3.5–5.0)
Alkaline Phosphatase: 47 U/L (ref 38–126)
Anion gap: 8 (ref 5–15)
BUN: 5 mg/dL — ABNORMAL LOW (ref 6–20)
CO2: 25 mmol/L (ref 22–32)
Calcium: 9.6 mg/dL (ref 8.9–10.3)
Chloride: 107 mmol/L (ref 98–111)
Creatinine, Ser: 0.7 mg/dL (ref 0.44–1.00)
GFR, Estimated: >60 mL/min (ref >60)
Glucose, Bld: 103 mg/dL — ABNORMAL HIGH (ref 70–99)
Potassium: 3.6 mmol/L (ref 3.5–5.1)
Sodium: 140 mmol/L (ref 135–145)
Total Bilirubin: 0.8 mg/dL (ref 0.3–1.2)
Total Protein: 7 g/dL (ref 6.5–8.1)

## 2022-09-29 LAB — CBC
HCT: 39 % (ref 36.0–46.0)
Hemoglobin: 12.9 g/dL (ref 12.0–15.0)
MCH: 30.3 pg (ref 26.0–34.0)
MCHC: 33.1 g/dL (ref 30.0–36.0)
MCV: 91.5 fL (ref 80.0–100.0)
Platelets: 211 10*3/uL (ref 150–400)
RBC: 4.26 MIL/uL (ref 3.87–5.11)
RDW: 12.2 % (ref 11.5–15.5)
WBC: 7.2 10*3/uL (ref 4.0–10.5)
nRBC: 0 % (ref 0.0–0.2)

## 2022-09-29 LAB — HCG, SERUM, QUALITATIVE: Preg, Serum: NEGATIVE

## 2022-09-29 LAB — WET PREP, GENITAL
Clue Cells Wet Prep HPF POC: NONE SEEN
Sperm: NONE SEEN
Trich, Wet Prep: NONE SEEN
WBC, Wet Prep HPF POC: <10 (ref <10)
Yeast Wet Prep HPF POC: NONE SEEN

## 2022-09-29 LAB — LIPASE, BLOOD: Lipase: 26 U/L (ref 11–51)

## 2022-09-29 MED ORDER — IOHEXOL 300 MG/ML  SOLN
80.0000 mL | Freq: Once | INTRAMUSCULAR | Status: AC | PRN
  Administered 2022-09-29: 80 mL via INTRAVENOUS

## 2022-09-29 MED ORDER — MORPHINE SULFATE (PF) 4 MG/ML IV SOLN
4.0000 mg | Freq: Once | INTRAVENOUS | Status: AC
  Administered 2022-09-29: 4 mg via INTRAVENOUS
  Filled 2022-09-29: qty 1

## 2022-09-29 MED ORDER — ONDANSETRON HCL 4 MG/2ML IJ SOLN
4.0000 mg | Freq: Once | INTRAMUSCULAR | Status: AC
  Administered 2022-09-29: 4 mg via INTRAVENOUS
  Filled 2022-09-29: qty 2

## 2022-09-29 MED ORDER — ORTHO-NOVUM 1/35 (28) 1-35 MG-MCG PO TABS: 1.0000 | ORAL_TABLET | Freq: Three times a day (TID) | ORAL | 0 refills | Status: AC

## 2022-09-29 NOTE — ED Notes (Signed)
Patient transported to CT 

## 2022-09-29 NOTE — Discharge Instructions (Signed)
Today you were seen in the emergency department for your bleeding and pain.    In the emergency department you had a CT scan and an ultrasound that were reassuring.    At home, please take Tylenol and ibuprofen as needed for your pain.  Please take the birth control pills we have prescribed you for the next 3 days to limit your bleeding.    Check your MyChart online for the results of any tests that had not resulted by the time you left the emergency department.   Follow-up with your OBGYN doctor in 2-3 days regarding your visit.    Return immediately to the emergency department if you experience any of the following: Shortness of breath, passing out, soaking 4 pads per hour for over 2 hours, or any other concerning symptoms.    Thank you for visiting our Emergency Department. It was a pleasure taking care of you today.

## 2022-09-29 NOTE — ED Triage Notes (Signed)
Pt arrives to ED with c/o RLQ abdominal pain and heavy menstrual bleeding w/ clots x3 days.

## 2022-09-29 NOTE — ED Provider Notes (Signed)
[facility] EMERGENCY DEPT Provider Note   CSN: [id number] Arrival date & time: 09/29/22  1145     History  Chief Complaint  Patient presents with   Abdominal Pain    Julie Lawson is a 31 y.o. female.  31 year old female with a history of asthma, anemia, and irregular menses who presents to the emergency department with right lower quadrant pain and vaginal bleeding.  Says that 3 days ago she started experiencing right lower quadrant pain.  Initially was intermittent and now has become more constant.  It is a dull pain.  6/10 in severity.  No nausea or vomiting or diarrhea.  Has been having vaginal bleeding and going through approximately 2 pads per hour with passage of large clots.  Is monogamous with her husband and has no concern for STIs and is not having any vaginal discharge.  Had her tubes tied but no other abdominal surgeries and still has her appendix.       Home Medications Prior to Admission medications   Medication Sig Start Date End Date Taking? Authorizing Provider  norethindrone-ethinyl estradiol 1/35 (ORTHO-NOVUM 1/35, 28,) tablet Take 1 tablet by mouth in the morning, at noon, and at bedtime for 7 days. 09/29/22 10/06/22 Yes Rondel Baton, MD  albuterol (PROVENTIL HFA;VENTOLIN HFA) 108 (90 Base) MCG/ACT inhaler Inhale 1-2 puffs into the lungs every 6 (six) hours as needed for wheezing or shortness of breath. Patient not taking: Reported on 08/03/2017 07/20/16   Hagler, Jami L, PA-C  antipyrine-benzocaine [name]) OTIC solution Place 3-4 drops into the right ear every 2 (two) hours as needed for ear pain. 01/07/18   Triplett, Tammy, PA-C  azithromycin (ZITHROMAX) 250 MG tablet Take first 2 tablets together, then 1 every day until finished. 01/07/18   Triplett, Tammy, PA-C  brompheniramine-pseudoephedrine-DM 30-2-10 MG/5ML syrup Take 5 mLs 4 (four) times daily as needed by mouth. 09/20/17   Triplett, Rulon Eisenmenger B, FNP  clindamycin (CLEOCIN) 150 MG  capsule Take 2 capsules (300 mg total) by mouth 4 (four) times daily. 10/04/18   Triplett, Tammy, PA-C  dicyclomine (BENTYL) 20 MG tablet Take 1 tablet (20 mg total) by mouth 3 (three) times daily as needed for spasms. 08/03/17   Emily Filbert, MD  docusate sodium (COLACE) 100 MG capsule Take 1 capsule (100 mg total) by mouth 2 (two) times daily. 12/16/15   Shambley, Melody N, CNM  EPINEPHrine (EPIPEN 2-PAK) 0.3 mg/0.3 mL IJ SOAJ injection Inject 0.3 mg into the muscle once.    [provider]  fluticasone (FLONASE) 50 MCG/ACT nasal spray Place 2 sprays into both nostrils daily. 07/20/16   Hagler, Jami L, PA-C  HYDROcodone-acetaminophen (NORCO/VICODIN) 5-325 MG tablet Take 1 tablet by mouth every 4 (four) hours as needed. 11/24/19   Henderly, Britni A, PA-C  hydrOXYzine (ATARAX/VISTARIL) 25 MG tablet Take 25 mg by mouth every 8 (eight) hours as needed. 10/02/19   [provider]  loperamide (IMODIUM A-D) 2 MG tablet Take 1 tablet (2 mg total) by mouth 4 (four) times daily as needed for diarrhea or loose stools. 08/03/17   Emily Filbert, MD  Nutritional Supplements (ENSURE HEALTHY MOM) LIQD Take 1 Bottle by mouth 2 (two) times daily at 8 am and 10 pm. 04/12/16   Hildred Laser, MD  phenazopyridine (PYRIDIUM) 200 MG tablet Take 1 tablet (200 mg total) by mouth 3 (three) times daily. 11/19/17   Liberty Handy, PA-C  predniSONE (STERAPRED UNI-PAK 21 TAB) 10 MG (21) TBPK tablet Take  6 tabs by mouth daily  for 1 day, then 5 tabs for 1 day, then 4 tabs for 1 day, then 3 tabs for 1 day, 2 tabs for 1 day, then 1 tab by mouth daily for 1 day 04/27/20   Avegno, Zachery Dakins, FNP  Prenat-FeFum-FePo-FA-Omega 3 (CONCEPT DHA) 53.5-38-1 MG CAPS Take 1 tablet by mouth daily. 12/16/15   Shambley, Melody N, CNM  prochlorperazine (COMPAZINE) 10 MG tablet Take 1 tablet (10 mg total) by mouth every 6 (six) hours as needed for nausea or vomiting. 06/04/18   Willy Eddy, MD  traMADol (ULTRAM) 50 MG  tablet Take 1 tablet (50 mg total) by mouth every 6 (six) hours as needed. 10/04/18   Triplett, Tammy, PA-C  triamcinolone cream (KENALOG) 0.1 % Apply 1 application topically 2 (two) times daily. 04/27/20   Avegno, Zachery Dakins, FNP      Allergies    Amoxicillin and Bee venom    Review of Systems   Review of Systems  Physical Exam Updated Vital Signs BP 124/77   Pulse 79   Temp 98.2 F (36.8 C) (Oral)   Resp 16   Ht 5\' 2"  (1.575 m)   Wt 64.9 kg   SpO2 100%   BMI 26.16 kg/m   Physical Exam Vitals and nursing note reviewed.  Constitutional:      General: She is not in acute distress.    Appearance: She is well-developed.  HENT:     Head: Normocephalic and atraumatic.     Right Ear: External ear normal.     Left Ear: External ear normal.     Nose: Nose normal.  Eyes:     Extraocular Movements: Extraocular movements intact.     Conjunctiva/sclera: Conjunctivae normal.     Pupils: Pupils are equal, round, and reactive to light.  Cardiovascular:     Rate and Rhythm: Normal rate and regular rhythm.  Pulmonary:     Effort: Pulmonary effort is normal. No respiratory distress.  Abdominal:     General: Abdomen is flat. There is no distension.     Palpations: Abdomen is soft. There is no mass.     Tenderness: There is abdominal tenderness (Right lower quadrant). There is no guarding.  Genitourinary:    Comments: Chaperoned by Pt's RN.  External genitalia unremarkable. Nor rashes or lesions noted.  Speculum exam with significant amount of blood in fornix.  Unable to visualize cervix.  Vaginal wall mucosa is unremarkable.  Bimanual exam without cervical motion tenderness, adnexal tenderness or any masses appreciated. Musculoskeletal:        General: No swelling.     Cervical back: Normal range of motion and neck supple.     Right lower leg: No edema.     Left lower leg: No edema.  Skin:    General: Skin is warm and dry.     Capillary Refill: Capillary refill takes less  than 2 seconds.  Neurological:     Mental Status: She is alert and oriented to person, place, and time. Mental status is at baseline.  Psychiatric:        Mood and Affect: Mood normal.     ED Results / Procedures / Treatments   Labs (all labs ordered are listed, but only abnormal results are displayed) Labs Reviewed  COMPREHENSIVE METABOLIC PANEL - Abnormal; Notable for the following components:      Result Value   Glucose, Bld 103 (*)    BUN 5 (*)    All other  components within normal limits  WET PREP, GENITAL  LIPASE, BLOOD  CBC  HCG, SERUM, QUALITATIVE  GC/CHLAMYDIA PROBE AMP ([place]) NOT AT [facility]    EKG None  Radiology US PELVIC COMPLETE W TRANSVAGINAL AND TORSION R/O  Result Date: 09/29/2022 CLINICAL DATA:  Pain right lower quadrant EXAM: TRANSABDOMINAL AND TRANSVAGINAL ULTRASOUND OF PELVIS DOPPLER ULTRASOUND OF OVARIES TECHNIQUE: Both transabdominal and transvaginal ultrasound examinations of the pelvis were performed. Transabdominal technique was performed for global imaging of the pelvis including uterus, ovaries, adnexal regions, and pelvic cul-de-sac. It was necessary to proceed with endovaginal exam following the transabdominal exam to visualize the endometrium and ovaries. Color and duplex Doppler ultrasound was utilized to evaluate blood flow to the ovaries. COMPARISON:  CT done earlier today FINDINGS: Uterus Measurements: 7.9 x 4.8 x 5.4 cm = volume: 100.7 mL. No fibroids or other mass visualized. Endometrium Thickness: 8.4 mm.  No focal abnormality visualized. Right ovary Measurements: 3.2 x 2 x 3.1 cm = volume: 10.4 mL. Normal appearance/no adnexal mass. Left ovary Measurements: 3 x 2.3 x 1.7 cm = volume: 6.2 mL. Normal appearance/no adnexal mass. Pulsed Doppler evaluation of both ovaries demonstrates normal low-resistance arterial and venous waveforms. Other findings Trace amount of free fluid is seen in cul-de-sac. IMPRESSION: There is no evidence of ovarian  torsion. Uterus and ovaries are unremarkable. Trace amount of free fluid in pelvis may suggest recent rupture of ovarian follicle. Electronically Signed   By: Ernie Avena M.D.   On: 09/29/2022 15:58   CT ABDOMEN PELVIS W CONTRAST  Result Date: 09/29/2022 CLINICAL DATA:  Right lower quadrant abdominal pain, vaginal bleeding. EXAM: CT ABDOMEN AND PELVIS WITH CONTRAST TECHNIQUE: Multidetector CT imaging of the abdomen and pelvis was performed using the standard protocol following bolus administration of intravenous contrast. RADIATION DOSE REDUCTION: This exam was performed according to the departmental dose-optimization program which includes automated exposure control, adjustment of the mA and/or kV according to patient size and/or use of iterative reconstruction technique. CONTRAST:  13mL OMNIPAQUE IOHEXOL 300 MG/ML  SOLN COMPARISON:  None Available. FINDINGS: Lower chest: No acute abnormality. Hepatobiliary: No focal liver abnormality is seen. No gallstones, gallbladder wall thickening, or biliary dilatation. Pancreas: Unremarkable. No pancreatic ductal dilatation or surrounding inflammatory changes. Spleen: Normal in size without focal abnormality. Adrenals/Urinary Tract: Adrenal glands are unremarkable. Kidneys are normal, without renal calculi, focal lesion, or hydronephrosis. Bladder is unremarkable. Stomach/Bowel: Stomach is within normal limits. Appendix appears normal. No evidence of bowel wall thickening, distention, or inflammatory changes. Vascular/Lymphatic: No significant vascular findings are present. No enlarged abdominal or pelvic lymph nodes. Reproductive: Uterus and bilateral adnexa are unremarkable. Other: No abdominal wall hernia or abnormality. No abdominopelvic ascites. Musculoskeletal: No acute or significant osseous findings. IMPRESSION: 1. No CT evidence of acute abdominal/pelvic process. 2. No evidence of nephrolithiasis or hydronephrosis. Symmetric perfusion of bilateral  kidneys. No evidence of pyelonephritis. 3. No evidence of appendicitis, colitis or diverticulitis. 4. Uterus and adnexa are unremarkable. Electronically Signed   By: Larose Hires D.O.   On: 09/29/2022 14:31    Procedures Procedures   Medications Ordered in ED Medications  morphine (PF) 4 MG/ML injection 4 mg (4 mg Intravenous Given 09/29/22 1421)  ondansetron (ZOFRAN) injection 4 mg (4 mg Intravenous Given 09/29/22 1421)  iohexol (OMNIPAQUE) 300 MG/ML solution 80 mL (80 mLs Intravenous Contrast Given 09/29/22 1356)    ED Course/ Medical Decision Making/ A&P Clinical Course as of 09/29/22 1635  Thu Sep 29, 2022  1610 CT abdomen  pelvis without any acute abnormality.  Ultrasound shows trace pelvic free fluid concerning for possible recent ruptured follicle. [RP]    Clinical Course User Index [RP] Rondel BatonPaterson, Sanna Porcaro C, MD                           Medical Decision Making Amount and/or Complexity of Data Reviewed Labs: ordered. Radiology: ordered.  Risk Prescription drug management.   Julie Lawson is a 31 y.o. female with comorbidities that complicate the patient evaluation including asthma, anemia, irregular menses who presents with chief complaint of right lower quadrant abdominal pain and vaginal bleeding.  This patient presents to the ED for concern of complaints listed in HPI, this involves an extensive number of treatment options, and is a complaint that carries with it a high risk of complications and morbidity. Disposition including potential need for admission considered.   Initial Ddx:  Menorrhagia, ruptured ovarian cyst, ovarian torsion, appendicitis, PID  MDM:  Unclear what is causing the patient's symptoms at this time but could be menorrhagia due to her chronic abnormal uterine bleeding.  Also on the differential would be ovarian pathology such as ruptured cyst or torsion.  With her pain in the right lower quadrant also considering appendicitis so will obtain CT  scan.  Considered PID but feel this is less likely since she is monogamous with her husband and is not reporting any other infectious symptoms at this time.  Plan:  Labs Transvaginal ultrasound CT abdomen pelvis with IV contrast Pelvic ultrasound Pelvic exam  ED Summary/Re-evaluation:  Patient reassessed and was having significant improvement of her pain.  Only had 1 pad that was soaked through while in the emergency department for several hours.  CT scan did not show evidence of appendicitis.  Pelvic ultrasound did not show evidence of torsion or large ovarian cyst.  No secondary signs of PID.  Given her lack of risk factors and current pelvic exam feel that PID is highly unlikely and may have been due to a ruptured follicle or cyst that she is having pain.  We will give the patient a short course of OCPs to treat her vaginal bleeding since she does not have any history of smoking or DVT/PE.  Also we will have the patient follow-up with her OB/GYN in 2 to 3 days.  Dispo: DC Home. Return precautions discussed including, but not limited to, those listed in the AVS. Allowed pt time to ask questions which were answered fully prior to dc.   Records reviewed Outpatient Clinic Notes The following labs were independently interpreted: Chemistry and CBC and show no acute abnormality I personally reviewed and interpreted cardiac monitoring: normal sinus rhythm  I personally reviewed and interpreted the pt's EKG: see above for interpretation  I have reviewed the patients home medications and made adjustments as needed  Final Clinical Impression(s) / ED Diagnoses Final diagnoses:  Vaginal bleeding  Right lower quadrant pain    Rx / DC Orders ED Discharge Orders          Ordered    norethindrone-ethinyl estradiol 1/35 (ORTHO-NOVUM 1/35, 28,) tablet  3 times daily        09/29/22 1628              Rondel BatonPaterson, Diannah Rindfleisch C, MD 09/29/22 1635

## 2022-09-30 LAB — GC/CHLAMYDIA PROBE AMP (~~LOC~~) NOT AT ARMC
Chlamydia: NEGATIVE
Comment: NEGATIVE
Comment: NORMAL
Neisseria Gonorrhea: NEGATIVE

## 2023-03-03 ENCOUNTER — Telehealth: Payer: 59

## 2023-03-03 ENCOUNTER — Telehealth: Payer: 59 | Admitting: Physician Assistant

## 2023-03-03 ENCOUNTER — Telehealth: Payer: 59 | Admitting: Family Medicine

## 2023-03-03 DIAGNOSIS — K0889 Other specified disorders of teeth and supporting structures: Secondary | ICD-10-CM

## 2023-03-03 MED ORDER — NAPROXEN 500 MG PO TABS
500.0000 mg | ORAL_TABLET | Freq: Two times a day (BID) | ORAL | 0 refills | Status: AC
Start: 2023-03-03 — End: ?

## 2023-03-03 MED ORDER — CLINDAMYCIN HCL 300 MG PO CAPS
300.0000 mg | ORAL_CAPSULE | Freq: Four times a day (QID) | ORAL | 0 refills | Status: DC
Start: 2023-03-03 — End: 2023-03-03

## 2023-03-03 MED ORDER — CLINDAMYCIN HCL 300 MG PO CAPS
300.0000 mg | ORAL_CAPSULE | Freq: Four times a day (QID) | ORAL | 0 refills | Status: AC
Start: 2023-03-03 — End: 2023-03-13

## 2023-03-03 NOTE — Progress Notes (Signed)
Virtual Visit Consent   Julie Lawson, you are scheduled for a virtual visit with a [facility] provider today. Just as with appointments in the office, your consent must be obtained to participate. Your consent will be active for this visit and any virtual visit you may have with one of our providers in the next 365 days. If you have a MyChart account, a copy of this consent can be sent to you electronically.  As this is a virtual visit, video technology does not allow for your provider to perform a traditional examination. This may limit your provider's ability to fully assess your condition. If your provider identifies any concerns that need to be evaluated in person or the need to arrange testing (such as labs, EKG, etc.), we will make arrangements to do so. Although advances in technology are sophisticated, we cannot ensure that it will always work on either your end or our end. If the connection with a video visit is poor, the visit may have to be switched to a telephone visit. With either a video or telephone visit, we are not always able to ensure that we have a secure connection.  By engaging in this virtual visit, you consent to the provision of healthcare and authorize for your insurance to be billed (if applicable) for the services provided during this visit. Depending on your insurance coverage, you may receive a charge related to this service.  I need to obtain your verbal consent now. Are you willing to proceed with your visit today? LUGENE HITT has provided verbal consent on 03/03/2023 for a virtual visit (video or telephone). Freddy Finner, NP  Date: 03/03/2023 1:42 PM  Virtual Visit via Video Note   I, Freddy Finner, connected with  JAMI BOGDANSKI  ([id number], 02-15-1991) on 03/03/23 at  2:00 PM EDT by a video-enabled telemedicine application and verified that I am speaking with the correct person using two identifiers.  Location: Patient: Virtual Visit Location  Patient: Home Provider: Virtual Visit Location Provider: Home Office   I discussed the limitations of evaluation and management by telemedicine and the availability of in person appointments. The patient expressed understanding and agreed to proceed.    History of Present Illness: Julie Lawson is a 32 y.o. who identifies as a female who was assigned female at birth, and is being seen today for dental pain.  Onset was in the last week with upper left dental pain. Has a deep cavity that needs a root canal.  Associated symptoms are trouble chewing and eating, tenderness over cheek. 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 (Patient not taking: Reported  on 08/03/2017), Disp: 1 Inhaler, Rfl: 0   antipyrine-benzocaine (AURALGAN) OTIC solution, Place 3-4 drops into the right ear every 2 (two) hours as needed for ear pain., Disp: 10 mL, Rfl: 0   azithromycin (ZITHROMAX) 250 MG tablet, Take first 2 tablets together, then 1 every day until finished., Disp: 6 tablet, Rfl: 0   brompheniramine-pseudoephedrine-DM 30-2-10 MG/5ML syrup, Take 5 mLs 4 (four) times  daily as needed by mouth., Disp: 120 mL, Rfl: 0   clindamycin (CLEOCIN) 150 MG capsule, Take 2 capsules (300 mg total) by mouth 4 (four) times daily., Disp: 56 capsule, Rfl: 0   dicyclomine (BENTYL) 20 MG tablet, Take 1 tablet (20 mg total) by mouth 3 (three) times daily as needed for spasms., Disp: 20 tablet, Rfl: 0   docusate sodium (COLACE) 100 MG capsule, Take 1 capsule (100 mg total) by mouth 2 (two) times daily., Disp: 60 capsule, Rfl: 1   EPINEPHrine (EPIPEN 2-PAK) 0.3 mg/0.3 mL IJ SOAJ injection, Inject 0.3 mg into the muscle once., Disp: , Rfl:    fluticasone (FLONASE) 50 MCG/ACT nasal spray, Place 2 sprays into both nostrils daily., Disp: 16 g, Rfl: 0   HYDROcodone-acetaminophen (NORCO/VICODIN) 5-325 MG tablet, Take 1 tablet by mouth every 4 (four) hours as needed., Disp: 10 tablet, Rfl: 0   hydrOXYzine (ATARAX/VISTARIL) 25 MG tablet, Take 25 mg by mouth every 8 (eight) hours as needed., Disp: , Rfl:    loperamide (IMODIUM A-D) 2 MG tablet, Take 1 tablet (2 mg total) by mouth 4 (four) times daily as needed for diarrhea or loose stools., Disp: 30 tablet, Rfl: 0   norethindrone-ethinyl estradiol 1/35 (ORTHO-NOVUM 1/35, 28,) tablet, Take 1 tablet by mouth in the morning, at noon, and at bedtime for 7 days., Disp: 21 tablet, Rfl: 0   Nutritional Supplements (ENSURE HEALTHY MOM) LIQD, Take 1 Bottle by mouth 2 (two) times daily at 8 am and 10 pm., Disp: 12 Bottle, Rfl: 3   phenazopyridine (PYRIDIUM) 200 MG tablet, Take 1 tablet (200 mg total) by mouth 3 (three) times daily., Disp: 6 tablet, Rfl: 0   predniSONE (STERAPRED UNI-PAK 21 TAB) 10 MG (21) TBPK tablet, Take 6 tabs by mouth daily  for 1 day, then 5 tabs for 1 day, then 4 tabs for 1 day, then 3 tabs for 1 day, 2 tabs for 1 day, then 1 tab by mouth daily for 1 day, Disp: 21 tablet, Rfl: 0   Prenat-FeFum-FePo-FA-Omega 3 (CONCEPT DHA) 53.5-38-1 MG CAPS, Take 1 tablet by mouth daily., Disp: 30 capsule, Rfl: 11   prochlorperazine (COMPAZINE) 10 MG  tablet, Take 1 tablet (10 mg total) by mouth every 6 (six) hours as needed for nausea or vomiting., Disp: 12 tablet, Rfl: 0   traMADol (ULTRAM) 50 MG tablet, Take 1 tablet (50 mg total) by mouth every 6 (six) hours as needed., Disp: 10 tablet, Rfl: 0   triamcinolone cream (KENALOG) 0.1 %, Apply 1 application topically 2 (two) times daily., Disp: 30 g, Rfl: 0  Observations/Objective: Patient is well-developed, well-nourished in no acute distress.  Resting comfortably  at home.  Head is normocephalic, atraumatic.  No labored breathing.  Speech is clear and coherent with logical content.  Patient is alert and oriented at baseline.    Assessment and Plan:  1. Pain, dental  - clindamycin (CLEOCIN) 300 MG capsule; Take 1 capsule (300 mg total) by mouth 4 (four) times daily for 10 days.  Dispense: 40 capsule; Refill: 0 - naproxen (NAPROSYN) 500 MG tablet;  Take 1 tablet (500 mg total) by mouth 2 (two) times daily with a meal.  Dispense: 30 tablet; Refill: 0  -talking well -no facial edema noted -will contact dentist today for appt in coming week -in person precautions reviewed  -above meds given PCN all  Reviewed side effects, risks and benefits of medication.    Patient acknowledged agreement and understanding of the plan.   Past Medical, Surgical, Social History, Allergies, and Medications have been Reviewed.     Follow Up Instructions: I discussed the assessment and treatment plan with the patient. The patient was provided an opportunity to ask questions and all were answered. The patient agreed with the plan and demonstrated an understanding of the instructions.  A copy of instructions were sent to the patient via MyChart unless otherwise noted below.   The patient was advised to call back or seek an in-person evaluation if the symptoms worsen or if the condition fails to improve as anticipated.  Time:  I spent 10 minutes with the patient via telehealth technology discussing the  above problems/concerns.    Freddy Finner, NP

## 2023-03-03 NOTE — Patient Instructions (Signed)
Karene Fry, thank you for joining Freddy Finner, NP for today's virtual visit.  While this provider is not your primary care provider (PCP), if your PCP is located in our provider database this encounter information will be shared with them immediately following your visit.   A [facility] MyChart account gives you access to today's visit and all your visits, tests, and labs performed at [facility] " click here if you don't have a [facility] MyChart account or go to [email or web address]  Consent: (Patient) Julie Lawson provided verbal consent for this virtual visit at the beginning of the encounter.  Current Medications:  Current Outpatient Medications:    naproxen (NAPROSYN) 500 MG tablet, Take 1 tablet (500 mg total) by mouth 2 (two) times daily with a meal., Disp: 30 tablet, Rfl: 0   albuterol (PROVENTIL HFA;VENTOLIN HFA) 108 (90 Base) MCG/ACT inhaler, Inhale 1-2 puffs into the lungs every 6 (six) hours as needed for wheezing or shortness of breath. (Patient not taking: Reported on 08/03/2017), Disp: 1 Inhaler, Rfl: 0   antipyrine-benzocaine (AURALGAN) OTIC solution, Place 3-4 drops into the right ear every 2 (two) hours as needed for ear pain., Disp: 10 mL, Rfl: 0   azithromycin (ZITHROMAX) 250 MG tablet, Take first 2 tablets together, then 1 every day until finished., Disp: 6 tablet, Rfl: 0   brompheniramine-pseudoephedrine-DM 30-2-10 MG/5ML syrup, Take 5 mLs 4 (four) times daily as needed by mouth., Disp: 120 mL, Rfl: 0   clindamycin (CLEOCIN) 300 MG capsule, Take 1 capsule (300 mg total) by mouth 4 (four) times daily for 10 days., Disp: 40 capsule, Rfl: 0   dicyclomine (BENTYL) 20 MG tablet, Take 1 tablet (20 mg total) by mouth 3 (three) times daily as needed for spasms., Disp: 20 tablet, Rfl: 0   docusate sodium (COLACE) 100 MG capsule, Take 1 capsule (100 mg total) by mouth 2 (two) times daily., Disp: 60 capsule, Rfl: 1   EPINEPHrine (EPIPEN 2-PAK) 0.3  mg/0.3 mL IJ SOAJ injection, Inject 0.3 mg into the muscle once., Disp: , Rfl:    fluticasone (FLONASE) 50 MCG/ACT nasal spray, Place 2 sprays into both nostrils daily., Disp: 16 g, Rfl: 0   HYDROcodone-acetaminophen (NORCO/VICODIN) 5-325 MG tablet, Take 1 tablet by mouth every 4 (four) hours as needed., Disp: 10 tablet, Rfl: 0   hydrOXYzine (ATARAX/VISTARIL) 25 MG tablet, Take 25 mg by mouth every 8 (eight) hours as needed., Disp: , Rfl:    loperamide (IMODIUM A-D) 2 MG tablet, Take 1 tablet (2 mg total) by mouth 4 (four) times daily as needed for diarrhea or loose stools., Disp: 30 tablet, Rfl: 0   norethindrone-ethinyl estradiol 1/35 (ORTHO-NOVUM 1/35, 28,) tablet, Take 1 tablet by mouth in the morning, at noon, and at bedtime for 7 days., Disp: 21 tablet, Rfl: 0   Nutritional Supplements (ENSURE HEALTHY MOM) LIQD, Take 1 Bottle by mouth 2 (two) times daily at 8 am and 10 pm., Disp: 12 Bottle, Rfl: 3   phenazopyridine (PYRIDIUM) 200 MG tablet, Take 1 tablet (200 mg total) by mouth 3 (three) times daily., Disp: 6 tablet, Rfl: 0   predniSONE (STERAPRED UNI-PAK 21 TAB) 10 MG (21) TBPK tablet, Take 6 tabs by mouth daily  for 1 day, then 5 tabs for 1 day, then 4 tabs for 1 day, then 3 tabs for 1 day, 2 tabs for 1 day, then 1 tab by mouth daily for 1 day, Disp: 21 tablet, Rfl: 0   Prenat-FeFum-FePo-FA-Omega 3 (CONCEPT DHA)  53.5-38-1 MG CAPS, Take 1 tablet by mouth daily., Disp: 30 capsule, Rfl: 11   prochlorperazine (COMPAZINE) 10 MG tablet, Take 1 tablet (10 mg total) by mouth every 6 (six) hours as needed for nausea or vomiting., Disp: 12 tablet, Rfl: 0   traMADol (ULTRAM) 50 MG tablet, Take 1 tablet (50 mg total) by mouth every 6 (six) hours as needed., Disp: 10 tablet, Rfl: 0   triamcinolone cream (KENALOG) 0.1 %, Apply 1 application topically 2 (two) times daily., Disp: 30 g, Rfl: 0   Medications ordered in this encounter:  Meds ordered this encounter  Medications   clindamycin (CLEOCIN) 300 MG  capsule    Sig: Take 1 capsule (300 mg total) by mouth 4 (four) times daily for 10 days.    Dispense:  40 capsule    Refill:  0    Order Specific Question:   Supervising Provider    Answer:   Merrilee Jansky [[id number]]   naproxen (NAPROSYN) 500 MG tablet    Sig: Take 1 tablet (500 mg total) by mouth 2 (two) times daily with a meal.    Dispense:  30 tablet    Refill:  0    Order Specific Question:   Supervising Provider    Answer:   Merrilee Jansky [id number]     *If you need refills on other medications prior to your next appointment, please contact your pharmacy*  Follow-Up: Call back or seek an in-person evaluation if the symptoms worsen or if the condition fails to improve as anticipated.  [facility] [phone]  Other Instructions  Call your dentist today and get an appt within the next 10 days   If you have been instructed to have an in-person evaluation today at a local Urgent Care facility, please use the link below. It will take you to a list of all of our available [facility], including address, phone number and hours of operation. Please do not delay care.  [facility]  If you or a family member do not have a primary care provider, use the link below to schedule a visit and establish care. When you choose a [facility] primary care physician or advanced practice provider, you gain a long-term partner in health. Find a Primary Care Provider  Learn more about [facility]'s in-office and virtual care options: [facility] - Get Care Now

## 2023-03-03 NOTE — Progress Notes (Signed)
Patient seen earlier via video. Pharmacy did not receive antibiotic Rx. Rx resent with confirmation of receipt from pharmacy.   No charge.

## 2023-09-01 ENCOUNTER — Telehealth: Payer: 59 | Admitting: Physician Assistant

## 2023-09-01 DIAGNOSIS — R110 Nausea: Secondary | ICD-10-CM

## 2023-09-01 DIAGNOSIS — K047 Periapical abscess without sinus: Secondary | ICD-10-CM | POA: Diagnosis not present

## 2023-09-01 MED ORDER — ONDANSETRON 4 MG PO TBDP
4.0000 mg | ORAL_TABLET | Freq: Three times a day (TID) | ORAL | 0 refills | Status: AC | PRN
Start: 2023-09-01 — End: ?

## 2023-09-01 MED ORDER — CLINDAMYCIN HCL 300 MG PO CAPS
300.0000 mg | ORAL_CAPSULE | Freq: Three times a day (TID) | ORAL | 0 refills | Status: AC
Start: 2023-09-01 — End: ?

## 2023-09-01 MED ORDER — NAPROXEN 500 MG PO TABS
500.0000 mg | ORAL_TABLET | Freq: Two times a day (BID) | ORAL | 0 refills | Status: AC
Start: 2023-09-01 — End: ?

## 2023-09-01 NOTE — Progress Notes (Signed)
Virtual Visit Consent   Julie Lawson, you are scheduled for a virtual visit with a [facility] provider today. Just as with appointments in the office, your consent must be obtained to participate. Your consent will be active for this visit and any virtual visit you may have with one of our providers in the next 365 days. If you have a MyChart account, a copy of this consent can be sent to you electronically.  As this is a virtual visit, video technology does not allow for your provider to perform a traditional examination. This may limit your provider's ability to fully assess your condition. If your provider identifies any concerns that need to be evaluated in person or the need to arrange testing (such as labs, EKG, etc.), we will make arrangements to do so. Although advances in technology are sophisticated, we cannot ensure that it will always work on either your end or our end. If the connection with a video visit is poor, the visit may have to be switched to a telephone visit. With either a video or telephone visit, we are not always able to ensure that we have a secure connection.  By engaging in this virtual visit, you consent to the provision of healthcare and authorize for your insurance to be billed (if applicable) for the services provided during this visit. Depending on your insurance coverage, you may receive a charge related to this service.  I need to obtain your verbal consent now. Are you willing to proceed with your visit today? Julie Lawson has provided verbal consent on 09/01/2023 for a virtual visit (video or telephone). Julie Loveless, PA-C  Date: 09/01/2023 5:26 PM  Virtual Visit via Video Note   I, Julie Lawson, connected with  Julie Lawson  ([id number], [birth year]) on 09/01/23 at  5:45 PM EDT by a video-enabled telemedicine application and verified that I am speaking with the correct person using two identifiers.  Location: Patient: Virtual  Visit Location Patient: Home Provider: Virtual Visit Location Provider: Home Office   I discussed the limitations of evaluation and management by telemedicine and the availability of in person appointments. The patient expressed understanding and agreed to proceed.    History of Present Illness: Julie Lawson is a 32 y.o. who identifies as a female who was assigned female at birth, and is being seen today for dental pain and abscess.  HPI: Dental Pain  This is a new problem. The current episode started yesterday. The problem occurs constantly. The problem has been gradually worsening. The pain is moderate. Associated symptoms include facial pain. Pertinent negatives include no difficulty swallowing, fever, sinus pressure or thermal sensitivity. Associated symptoms comments: Noticed a white bump on the area where her wisdom tooth was removed (over a year ago). She has tried NSAIDs for the symptoms. The treatment provided no relief.     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    Allergies:  Allergies  Allergen Reactions   Amoxicillin Hives   Bee Venom     Has EPIPEN   Medications:  Current Outpatient Medications:    clindamycin (CLEOCIN) 300 MG  capsule, Take 1 capsule (300 mg total) by mouth 3 (three) times daily., Disp: 30 capsule, Rfl: 0   naproxen (NAPROSYN) 500 MG tablet, Take 1 tablet (500 mg total) by mouth 2 (two) times daily with a meal., Disp: 30 tablet, Rfl: 0   ondansetron (ZOFRAN-ODT) 4 MG disintegrating tablet, Take 1 tablet (4 mg total) by mouth every 8 (eight) hours as needed., Disp: 20  tablet, Rfl: 0   albuterol (PROVENTIL HFA;VENTOLIN HFA) 108 (90 Base) MCG/ACT inhaler, Inhale 1-2 puffs into the lungs every 6 (six) hours as needed for wheezing or shortness of breath. (Patient not taking: Reported on 08/03/2017), Disp: 1 Inhaler, Rfl: 0   antipyrine-benzocaine (AURALGAN) OTIC solution, Place 3-4 drops into the right ear every 2 (two) hours as needed for ear pain., Disp: 10 mL, Rfl: 0   brompheniramine-pseudoephedrine-DM 30-2-10 MG/5ML syrup, Take 5 mLs 4 (four) times daily as needed by mouth., Disp: 120 mL, Rfl: 0   dicyclomine (BENTYL) 20 MG tablet, Take 1 tablet (20 mg total) by mouth 3 (three) times daily as needed for spasms., Disp: 20 tablet, Rfl: 0   docusate sodium (COLACE) 100 MG capsule, Take 1 capsule (100 mg total) by mouth 2 (two) times daily., Disp: 60 capsule, Rfl: 1   EPINEPHrine (EPIPEN 2-PAK) 0.3 mg/0.3 mL IJ SOAJ injection, Inject 0.3 mg into the muscle once., Disp: , Rfl:    fluticasone (FLONASE) 50 MCG/ACT nasal spray, Place 2 sprays into both nostrils daily., Disp: 16 g, Rfl: 0   HYDROcodone-acetaminophen (NORCO/VICODIN) 5-325 MG tablet, Take 1 tablet by mouth every 4 (four) hours as needed., Disp: 10 tablet, Rfl: 0   hydrOXYzine (ATARAX/VISTARIL) 25 MG tablet, Take 25 mg by mouth every 8 (eight) hours as needed., Disp: , Rfl:    loperamide (IMODIUM A-D) 2 MG tablet, Take 1 tablet (2 mg total) by mouth 4 (four) times daily as needed for diarrhea or loose stools., Disp: 30 tablet, Rfl: 0   naproxen (NAPROSYN) 500 MG tablet, Take 1 tablet (500 mg total) by mouth 2 (two) times daily with a meal., Disp: 30 tablet, Rfl: 0   norethindrone-ethinyl estradiol 1/35 (ORTHO-NOVUM 1/35, 28,) tablet, Take 1 tablet by mouth in the morning, at noon, and at bedtime for 7 days., Disp: 21 tablet, Rfl: 0   Nutritional Supplements (ENSURE HEALTHY MOM) LIQD, Take 1 Bottle by mouth 2 (two) times daily at 8 am and 10 pm., Disp: 12 Bottle, Rfl: 3   phenazopyridine (PYRIDIUM) 200 MG  tablet, Take 1 tablet (200 mg total) by mouth 3 (three) times daily., Disp: 6 tablet, Rfl: 0   predniSONE (STERAPRED UNI-PAK 21 TAB) 10 MG (21) TBPK tablet, Take 6 tabs by mouth daily  for 1 day, then 5 tabs for 1 day, then 4 tabs for 1 day, then 3 tabs for 1 day, 2 tabs for 1 day, then 1 tab by mouth daily for 1 day, Disp: 21 tablet, Rfl: 0   Prenat-FeFum-FePo-FA-Omega 3 (CONCEPT DHA) 53.5-38-1 MG CAPS, Take 1 tablet by mouth daily., Disp: 30 capsule, Rfl: 11   prochlorperazine (COMPAZINE) 10 MG tablet, Take 1 tablet (10 mg total) by mouth every 6 (six) hours as needed for nausea or vomiting., Disp: 12 tablet, Rfl: 0   traMADol (ULTRAM) 50 MG tablet, Take 1 tablet (50 mg total) by mouth every 6 (six) hours as needed., Disp: 10 tablet, Rfl: 0   triamcinolone cream (KENALOG) 0.1 %, Apply 1 application topically 2 (two) times daily., Disp: 30 g, Rfl: 0  Observations/Objective: Patient is well-developed, well-nourished in no acute distress.  Resting comfortably at home.  Head is normocephalic, atraumatic.  No labored breathing.  Speech is clear and coherent with logical content.  Patient is alert and oriented at baseline.    Assessment and Plan: 1. Dental abscess - clindamycin (CLEOCIN) 300 MG capsule; Take 1 capsule (300 mg total) by mouth 3 (three) times daily.  Dispense: 30 capsule; Refill: 0 - naproxen (NAPROSYN) 500 MG tablet; Take 1 tablet (500 mg total) by mouth 2 (two) times daily with a meal.  Dispense: 30 tablet; Refill: 0  2. Nausea - ondansetron (ZOFRAN-ODT) 4 MG disintegrating tablet; Take 1 tablet (4 mg total) by mouth every 8 (eight) hours as needed.  Dispense: 20 tablet; Refill: 0  - Suspected infection with broken tooth - Clindamycin and Naproxen prescribed - Zofran for nausea - Can use ice on outside jaw/cheek for swelling - Can also take tylenol for pain with other medications - Discussed DenTemp putty that can be used to cover a broken tooth - Schedule a follow with a  dentist as soon as possible (Can contact [place] [facility] clinic if underinsured or uninsured) - Seek in person evaluation if symptoms fail to improve or if they worsen   Follow Up Instructions: I discussed the assessment and treatment plan with the patient. The patient was provided an opportunity to ask questions and all were answered. The patient agreed with the plan and demonstrated an understanding of the instructions.  A copy of instructions were sent to the patient via MyChart unless otherwise noted below.    The patient was advised to call back or seek an in-person evaluation if the symptoms worsen or if the condition fails to improve as anticipated.    Julie Loveless, PA-C

## 2023-09-01 NOTE — Patient Instructions (Signed)
Julie Lawson, thank you for joining [name], PA-C for today's virtual visit.  While this provider is not your primary care provider (PCP), if your PCP is located in our provider database this encounter information will be shared with them immediately following your visit.   A [facility] MyChart account gives you access to today's visit and all your visits, tests, and labs performed at [facility] " click here if you don't have a [facility] MyChart account or go to [email or web address]  Consent: (Patient) Julie Lawson provided verbal consent for this virtual visit at the beginning of the encounter.  Current Medications:  Current Outpatient Medications:    clindamycin (CLEOCIN) 300 MG capsule, Take 1 capsule (300 mg total) by mouth 3 (three) times daily., Disp: 30 capsule, Rfl: 0   naproxen (NAPROSYN) 500 MG tablet, Take 1 tablet (500 mg total) by mouth 2 (two) times daily with a meal., Disp: 30 tablet, Rfl: 0   ondansetron (ZOFRAN-ODT) 4 MG disintegrating tablet, Take 1 tablet (4 mg total) by mouth every 8 (eight) hours as needed., Disp: 20 tablet, Rfl: 0   albuterol (PROVENTIL HFA;VENTOLIN HFA) 108 (90 Base) MCG/ACT inhaler, Inhale 1-2 puffs into the lungs every 6 (six) hours as needed for wheezing or shortness of breath. (Patient not taking: Reported on 08/03/2017), Disp: 1 Inhaler, Rfl: 0   antipyrine-benzocaine (AURALGAN) OTIC solution, Place 3-4 drops into the right ear every 2 (two) hours as needed for ear pain., Disp: 10 mL, Rfl: 0   brompheniramine-pseudoephedrine-DM 30-2-10 MG/5ML syrup, Take 5 mLs 4 (four) times daily as needed by mouth., Disp: 120 mL, Rfl: 0   dicyclomine (BENTYL) 20 MG tablet, Take 1 tablet (20 mg total) by mouth 3 (three) times daily as needed for spasms., Disp: 20 tablet, Rfl: 0   docusate sodium (COLACE) 100 MG capsule, Take 1 capsule (100 mg total) by mouth 2 (two) times daily., Disp: 60 capsule, Rfl: 1   EPINEPHrine  (EPIPEN 2-PAK) 0.3 mg/0.3 mL IJ SOAJ injection, Inject 0.3 mg into the muscle once., Disp: , Rfl:    fluticasone (FLONASE) 50 MCG/ACT nasal spray, Place 2 sprays into both nostrils daily., Disp: 16 g, Rfl: 0   HYDROcodone-acetaminophen (NORCO/VICODIN) 5-325 MG tablet, Take 1 tablet by mouth every 4 (four) hours as needed., Disp: 10 tablet, Rfl: 0   hydrOXYzine (ATARAX/VISTARIL) 25 MG tablet, Take 25 mg by mouth every 8 (eight) hours as needed., Disp: , Rfl:    loperamide (IMODIUM A-D) 2 MG tablet, Take 1 tablet (2 mg total) by mouth 4 (four) times daily as needed for diarrhea or loose stools., Disp: 30 tablet, Rfl: 0   naproxen (NAPROSYN) 500 MG tablet, Take 1 tablet (500 mg total) by mouth 2 (two) times daily with a meal., Disp: 30 tablet, Rfl: 0   norethindrone-ethinyl estradiol 1/35 (ORTHO-NOVUM 1/35, 28,) tablet, Take 1 tablet by mouth in the morning, at noon, and at bedtime for 7 days., Disp: 21 tablet, Rfl: 0   Nutritional Supplements (ENSURE HEALTHY MOM) LIQD, Take 1 Bottle by mouth 2 (two) times daily at 8 am and 10 pm., Disp: 12 Bottle, Rfl: 3   phenazopyridine (PYRIDIUM) 200 MG tablet, Take 1 tablet (200 mg total) by mouth 3 (three) times daily., Disp: 6 tablet, Rfl: 0   predniSONE (STERAPRED UNI-PAK 21 TAB) 10 MG (21) TBPK tablet, Take 6 tabs by mouth daily  for 1 day, then 5 tabs for 1 day, then 4 tabs for 1 day, then 3 tabs  for 1 day, 2 tabs for 1 day, then 1 tab by mouth daily for 1 day, Disp: 21 tablet, Rfl: 0   Prenat-FeFum-FePo-FA-Omega 3 (CONCEPT DHA) 53.5-38-1 MG CAPS, Take 1 tablet by mouth daily., Disp: 30 capsule, Rfl: 11   prochlorperazine (COMPAZINE) 10 MG tablet, Take 1 tablet (10 mg total) by mouth every 6 (six) hours as needed for nausea or vomiting., Disp: 12 tablet, Rfl: 0   traMADol (ULTRAM) 50 MG tablet, Take 1 tablet (50 mg total) by mouth every 6 (six) hours as needed., Disp: 10 tablet, Rfl: 0   triamcinolone cream (KENALOG) 0.1 %, Apply 1 application topically 2 (two)  times daily., Disp: 30 g, Rfl: 0   Medications ordered in this encounter:  Meds ordered this encounter  Medications   clindamycin (CLEOCIN) 300 MG capsule    Sig: Take 1 capsule (300 mg total) by mouth 3 (three) times daily.    Dispense:  30 capsule    Refill:  0    Order Specific Question:   Supervising Provider    Answer:   LAMPTEY, PHILIP O [[id number]]   ondansetron (ZOFRAN-ODT) 4 MG disintegrating tablet    Sig: Take 1 tablet (4 mg total) by mouth every 8 (eight) hours as needed.    Dispense:  20 tablet    Refill:  0    Order Specific Question:   Supervising Provider    Answer:   Merrilee Jansky [[id number]]   naproxen (NAPROSYN) 500 MG tablet    Sig: Take 1 tablet (500 mg total) by mouth 2 (two) times daily with a meal.    Dispense:  30 tablet    Refill:  0    Order Specific Question:   Supervising Provider    Answer:   Merrilee Jansky [id number]     *If you need refills on other medications prior to your next appointment, please contact your pharmacy*  Follow-Up: Call back or seek an in-person evaluation if the symptoms worsen or if the condition fails to improve as anticipated.  [facility] [phone]  Other Instructions Dental Abscess  A dental abscess is an infection around a tooth that may involve pain, swelling, and a collection of pus, as well as other symptoms. Treatment is important to help with symptoms and to prevent the infection from spreading. The general types of dental abscesses are: Pulpal abscess. This abscess may form from the inner part of the tooth (pulp). Periodontal abscess. This abscess may form from the gum. What are the causes? This condition is caused by a bacterial infection in or around the tooth. It may result from: Severe tooth decay (cavities). Trauma to the tooth, such as a broken or chipped tooth. What increases the risk? This condition is more likely to develop in males. It is also more likely to develop in people  who: Have cavities. Have severe gum disease. Eat sugary snacks between meals. Use tobacco products. Have diabetes. Have a weakened disease-fighting system (immune system). Do not brush and care for their teeth regularly. What are the signs or symptoms? Mild symptoms of this condition include: Tenderness. Bad breath. Fever. A bitter taste in the mouth. Pain in and around the infected tooth. Moderate symptoms of this condition include: Swollen neck glands. Chills. Pus drainage. Swelling and redness around the infected tooth, in the mouth, or in the face. Severe pain in and around the infected tooth. Severe symptoms of this condition include: Difficulty swallowing. Difficulty opening the mouth. Nausea. Vomiting. How  is this diagnosed? This condition is diagnosed based on: Your symptoms and your medical and dental history. An examination of the infected tooth. During the exam, your dental care provider may tap on the infected tooth. You may also need to have X-rays taken of the affected area. How is this treated? This condition is treated by getting rid of the infection. This may be done with: Antibiotic medicines. These may be used in certain situations. Antibacterial mouth rinse. Incision and drainage. This procedure is done by making an incision in the abscess to drain out the pus. Removing pus is the first priority in treating an abscess. A root canal. This may be performed to save the tooth. Your dental care provider accesses the visible part of your tooth (crown) with a drill and removes any infected pulp. Then the space is filled and sealed off. Tooth extraction. The tooth is pulled out if it cannot be saved by other treatment. You may also receive treatment for pain, such as: Acetaminophen or NSAIDs. Gels that contain a numbing medicine. An injection to block the pain near your nerve. Follow these instructions at home: Medicines Take over-the-counter and prescription  medicines only as told by your dental care provider. If you were prescribed an antibiotic, take it as told by your dental care provider. Do not stop taking the antibiotic even if you start to feel better. If you were prescribed a gel that contains a numbing medicine, use it exactly as told in the directions. Do not use these gels for children who are younger than 58 years of age. Use an antibacterial mouth rinse as told by your dental care provider. General instructions  Gargle with a mixture of salt and water 3-4 times a day or as needed. To make salt water, completely dissolve -1 tsp (3-6 g) of salt in 1 cup (237 mL) of warm water. Eat a soft diet while your abscess is healing. Drink enough fluid to keep your urine pale yellow. Do not apply heat to the outside of your mouth. Do not use any products that contain nicotine or tobacco. These products include cigarettes, chewing tobacco, and vaping devices, such as e-cigarettes. If you need help quitting, ask your dental care provider. Keep all follow-up visits. This is important. How is this prevented?  Excellent dental home care, which includes brushing your teeth every morning and night with fluoride toothpaste. Floss one time each day. Get regularly scheduled dental cleanings. Consider having a dental sealant applied on teeth that have deep grooves to prevent cavities. Drink fluoridated water regularly. This includes most tap water. Check the label on bottled water to see if it contains fluoride. Reduce or eliminate sugary drinks. Eat healthy meals and snacks. Wear a mouth guard or face shield to protect your teeth while playing sports. Contact a health care provider if: Your pain is worse and is not helped by medicine. You have swelling. You see pus around the tooth. You have a fever or chills. Get help right away if: Your symptoms suddenly get worse. You have a very bad headache. You have problems breathing or swallowing. You have  trouble opening your mouth. You have swelling in your neck or around your eye. These symptoms may represent a serious problem that is an emergency. Do not wait to see if the symptoms will go away. Get medical help right away. Call your local emergency services (911 in the [place] Do not drive yourself to the hospital. Summary A dental abscess is a collection of  pus in or around a tooth that results from an infection. A dental abscess may result from severe tooth decay, trauma to the tooth, or severe gum disease around a tooth. Symptoms include severe pain, swelling, redness, and drainage of pus in and around the infected tooth. The first priority in treating a dental abscess is to drain out the pus. Treatment may also involve removing damage inside the tooth (root canal) or extracting the tooth. This information is not intended to replace advice given to you by your health care provider. Make sure you discuss any questions you have with your health care provider. Document Revised: 01/07/2021 Document Reviewed: 01/07/2021 Elsevier Patient Education  2024 Elsevier Inc.    If you have been instructed to have an in-person evaluation today at a local Urgent Care facility, please use the link below. It will take you to a list of all of our available [facility], including address, phone number and hours of operation. Please do not delay care.  [facility]  If you or a family member do not have a primary care provider, use the link below to schedule a visit and establish care. When you choose a [facility] primary care physician or advanced practice provider, you gain a long-term partner in health. Find a Primary Care Provider  Learn more about [facility]'s in-office and virtual care options: [facility] - Get Care Now

## 2023-10-24 ENCOUNTER — Ambulatory Visit
Admission: EM | Admit: 2023-10-24 | Discharge: 2023-10-24 | Disposition: A | Discharge status: Home / Self Care | Payer: 59 | Attending: Family Medicine | Admitting: Family Medicine

## 2023-10-24 ENCOUNTER — Encounter: Payer: Self-pay | Admitting: Emergency Medicine

## 2023-10-24 ENCOUNTER — Other Ambulatory Visit: Payer: Self-pay

## 2023-10-24 DIAGNOSIS — J069 Acute upper respiratory infection, unspecified: Secondary | ICD-10-CM | POA: Diagnosis not present

## 2023-10-24 LAB — POC COVID19/FLU A&B COMBO
Covid Antigen, POC: NEGATIVE
Influenza A Antigen, POC: NEGATIVE
Influenza B Antigen, POC: NEGATIVE

## 2023-10-24 MED ORDER — FLUTICASONE PROPIONATE 50 MCG/ACT NA SUSP: 1.0000 | Freq: Two times a day (BID) | NASAL | 2 refills | Status: AC

## 2023-10-24 MED ORDER — PROMETHAZINE-DM 6.25-15 MG/5ML PO SYRP: 5.0000 mL | ORAL_SOLUTION | Freq: Four times a day (QID) | ORAL | 0 refills | Status: AC | PRN

## 2023-10-24 NOTE — ED Triage Notes (Addendum)
Pt reports headache, sore throat, nasal drainage, right ear pain/"fluid", neck pain, body aches since Sunday.

## 2023-10-24 NOTE — Discharge Instructions (Signed)
Your COVID and flu testing was negative today.  I have sent over some medications to help with your symptoms and you may also take DayQuil, NyQuil, ibuprofen and Tylenol and other supportive remedies.

## 2023-10-27 NOTE — ED Provider Notes (Signed)
[facility]    CSN: [id number] Arrival date & time: 10/24/23  1719      History   Chief Complaint Chief Complaint  Patient presents with   Headache    HPI Julie Lawson is a 32 y.o. female.   Presenting today with several day history of headache, sore throat, drainage, right ear pressure, body aches.  Denies fever, chills, chest pain, shortness of breath, abdominal pain, nausea vomiting or diarrhea.  So far trying over-the-counter remedies with minimal relief.  No known sick contacts recently.  History of asthma as a child, not currently on anything for this.    Past Medical History:  Diagnosis Date   Asthma    no history of exacerbations since childhood.    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 Authorizing Provider  fluticasone (FLONASE) 50 MCG/ACT nasal spray Place 1 spray into both nostrils 2 (two) times daily. 10/24/23  Yes Particia Nearing, PA-C  promethazine-dextromethorphan (PROMETHAZINE-DM) 6.25-15 MG/5ML syrup Take 5 mLs by mouth 4 (four) times daily as needed. 10/24/23  Yes Particia Nearing, PA-C  albuterol (PROVENTIL HFA;VENTOLIN HFA) 108 (90 Base) MCG/ACT inhaler Inhale 1-2 puffs into the lungs every 6 (six)  hours as needed for wheezing or shortness of breath. Patient not taking: Reported on 08/03/2017 07/20/16   Hagler, Jami L, PA-C  antipyrine-benzocaine [name]) OTIC solution Place 3-4 drops into the right ear every 2 (two) hours as needed for ear pain. 01/07/18   Triplett, Tammy, PA-C  brompheniramine-pseudoephedrine-DM 30-2-10 MG/5ML syrup Take 5 mLs 4 (four) times daily as needed by mouth. 09/20/17   Triplett, Rulon Eisenmenger B, FNP  clindamycin (CLEOCIN) 300 MG capsule Take 1 capsule (300 mg total) by mouth 3 (three) times daily. 09/01/23   [name], PA-C  dicyclomine (BENTYL) 20 MG tablet Take 1 tablet (20 mg total) by mouth 3 (three) times daily as needed for spasms. 08/03/17   Emily Filbert, MD  docusate sodium (COLACE) 100 MG capsule Take 1 capsule (100 mg total) by mouth 2 (two) times daily. 12/16/15   Shambley, Melody N, CNM  EPINEPHrine (EPIPEN 2-PAK) 0.3 mg/0.3 mL IJ SOAJ injection Inject 0.3 mg into the muscle once.    [provider]  fluticasone (FLONASE) 50 MCG/ACT nasal spray Place 2 sprays into both nostrils daily. 07/20/16   Hagler, Jami L, PA-C  HYDROcodone-acetaminophen (NORCO/VICODIN) 5-325 MG tablet Take 1 tablet by mouth every 4 (four) hours as needed. 11/24/19  Henderly, Britni A, PA-C  hydrOXYzine (ATARAX/VISTARIL) 25 MG tablet Take 25 mg by mouth every 8 (eight) hours as needed. 10/02/19   [provider]  loperamide (IMODIUM A-D) 2 MG tablet Take 1 tablet (2 mg total) by mouth 4 (four) times daily as needed for diarrhea or loose stools. 08/03/17   Emily Filbert, MD  naproxen (NAPROSYN) 500 MG tablet Take 1 tablet (500 mg total) by mouth 2 (two) times daily with a meal. 03/03/23   Freddy Finner, NP  naproxen (NAPROSYN) 500 MG tablet Take 1 tablet (500 mg total) by mouth 2 (two) times daily with a meal. 09/01/23   [name], PA-C  norethindrone-ethinyl estradiol 1/35 (ORTHO-NOVUM 1/35, 28,) tablet Take 1 tablet by mouth in the morning, at  noon, and at bedtime for 7 days. 09/29/22 10/06/22  Rondel Baton, MD  Nutritional Supplements (ENSURE HEALTHY MOM) LIQD Take 1 Bottle by mouth 2 (two) times daily at 8 am and 10 pm. 04/12/16   Hildred Laser, MD  ondansetron (ZOFRAN-ODT) 4 MG disintegrating tablet Take 1 tablet (4 mg total) by mouth every 8 (eight) hours as needed. 09/01/23   [name], PA-C  phenazopyridine (PYRIDIUM) 200 MG tablet Take 1 tablet (200 mg total) by mouth 3 (three) times daily. 11/19/17   Liberty Handy, PA-C  predniSONE (STERAPRED UNI-PAK 21 TAB) 10 MG (21) TBPK tablet Take 6 tabs by mouth daily  for 1 day, then 5 tabs for 1 day, then 4 tabs for 1 day, then 3 tabs for 1 day, 2 tabs for 1 day, then 1 tab by mouth daily for 1 day 04/27/20   Avegno, Zachery Dakins, FNP  Prenat-FeFum-FePo-FA-Omega 3 (CONCEPT DHA) 53.5-38-1 MG CAPS Take 1 tablet by mouth daily. 12/16/15   Shambley, Melody N, CNM  prochlorperazine (COMPAZINE) 10 MG tablet Take 1 tablet (10 mg total) by mouth every 6 (six) hours as needed for nausea or vomiting. 06/04/18   Willy Eddy, MD  traMADol (ULTRAM) 50 MG tablet Take 1 tablet (50 mg total) by mouth every 6 (six) hours as needed. 10/04/18   Triplett, Tammy, PA-C  triamcinolone cream (KENALOG) 0.1 % Apply 1 application topically 2 (two) times daily. 04/27/20   Avegno, Zachery Dakins, FNP    Family History Family History  Problem Relation Age of Onset   Migraines Mother    Diabetes Maternal Grandmother    Heart failure Maternal Grandfather    Arthritis Other        surgery    Social History Social History   Tobacco Use   Smoking status: Never   Smokeless tobacco: Never  Vaping Use   Vaping status: Never Used  Substance Use Topics   Alcohol use: No    Alcohol/week: 0.0 standard drinks of alcohol    Comment: occ   Drug use: No     Allergies   Amoxicillin and Bee venom   Review of Systems Review of Systems Per HPI  Physical Exam Triage Vital Signs ED Triage Vitals   Encounter Vitals Group     BP 10/24/23 1752 118/73     Systolic BP Percentile --      Diastolic BP Percentile --      Pulse Rate 10/24/23 1752 94     Resp 10/24/23 1752 20     Temp 10/24/23 1752 98 F (36.7 C)     Temp Source 10/24/23 1752 Oral     SpO2 10/24/23 1752 98 %     Weight --  Height --      Head Circumference --      Peak Flow --      Pain Score 10/24/23 1753 5     Pain Loc --      Pain Education --      Exclude from Growth Chart --    No data found.  Updated Vital Signs BP 118/73 (BP Location: Right Arm)   Pulse 94   Temp 98 F (36.7 C) (Oral)   Resp 20   SpO2 98%   Breastfeeding No   Visual Acuity Right Eye Distance:   Left Eye Distance:   Bilateral Distance:    Right Eye Near:   Left Eye Near:    Bilateral Near:     Physical Exam   UC Treatments / Results  Labs (all labs ordered are listed, but only abnormal results are displayed) Labs Reviewed  POC COVID19/FLU A&B COMBO    EKG   Radiology No results found.  Procedures Procedures (including critical care time)  Medications Ordered in UC Medications - No data to display  Initial Impression / Assessment and Plan / UC Course  I have reviewed the triage vital signs and the nursing notes.  Pertinent labs & imaging results that were available during my care of the patient were reviewed by me and considered in my medical decision making (see chart for details).     Vitals and exam overall reassuring today, rapid flu and COVID-negative, suspect viral respiratory infection.  Treat with Flonase, Phenergan DM, supportive over-the-counter medications and home care.  Return for worsening symptoms.  Final Clinical Impressions(s) / UC Diagnoses   Final diagnoses:  Viral URI with cough     Discharge Instructions      Your COVID and flu testing was negative today.  I have sent over some medications to help with your symptoms and you may also take DayQuil, NyQuil, ibuprofen and Tylenol  and other supportive remedies.    ED Prescriptions     Medication Sig Dispense Auth. Provider   fluticasone (FLONASE) 50 MCG/ACT nasal spray Place 1 spray into both nostrils 2 (two) times daily. 16 g Roosvelt Maser [place], [place]   promethazine-dextromethorphan (PROMETHAZINE-DM) 6.25-15 MG/5ML syrup Take 5 mLs by mouth 4 (four) times daily as needed. 100 mL Particia Nearing, [place]      PDMP not reviewed this encounter.   Particia Nearing, [place] 10/27/23 1515

## 2023-11-05 IMAGING — CT CT KNEE*L* W/CM
1 series · 12 of 14 positions shown, 15 images · non-contrast
Comparison: None Available.

CLINICAL DATA: Left knee pain and swelling.

EXAM:
CT ARTHROGRAPHY OF THE LEFT KNEE
TECHNIQUE: Multidetector CT imaging was performed following the standard
protocol after injection of dilute contrast into the joint.
RADIATION DOSE REDUCTION: This exam was performed according to the
departmental dose-optimization program which includes automated
exposure control, adjustment of the mA and/or kV according to
patient size and/or use of iterative reconstruction technique.

[Series 4: knee soft tissue · axial · 0.31mm/px · z∈[-317,-122]mm · 12 of 77 slices shown, 15 images]
[im 6/77  soft-tissue]
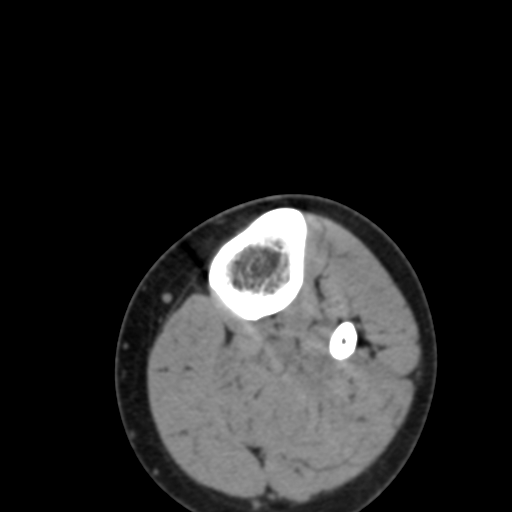
[im 6/77  bone]
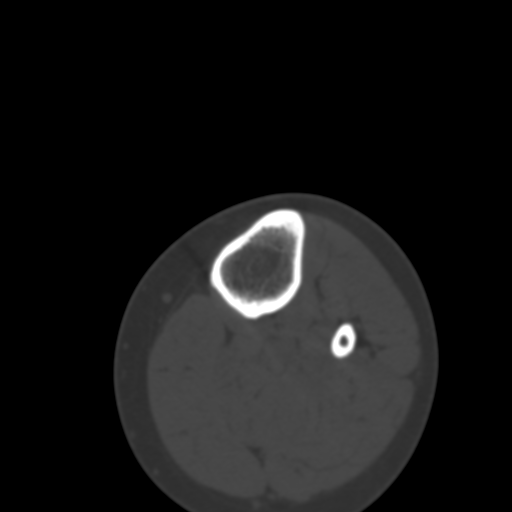
[im 12/77  bone]
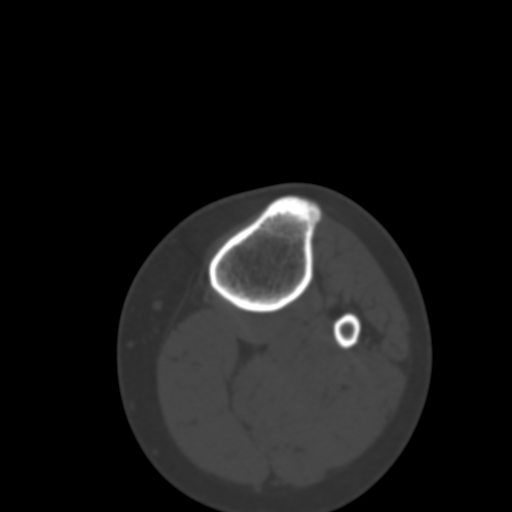
[im 18/77  bone]
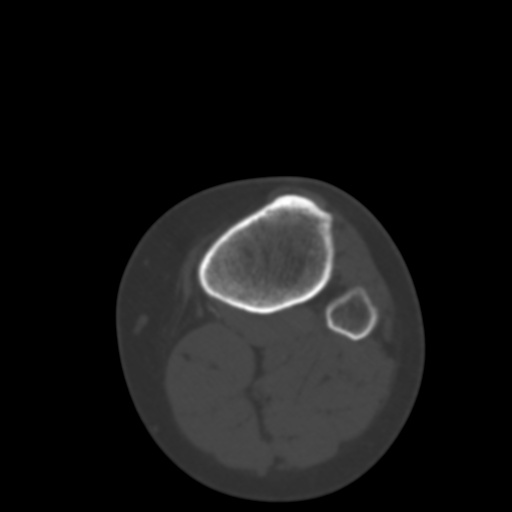
[im 24/77  bone]
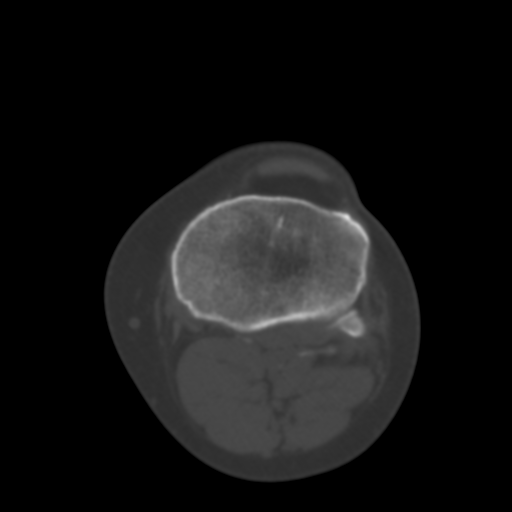
[im 30/77  soft-tissue]
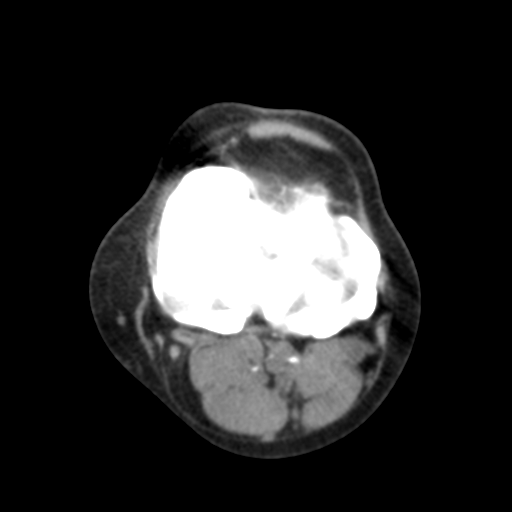
[im 30/77  bone]
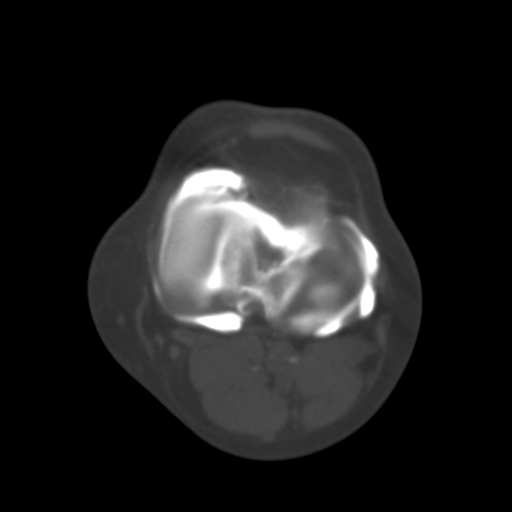
[im 36/77  bone]
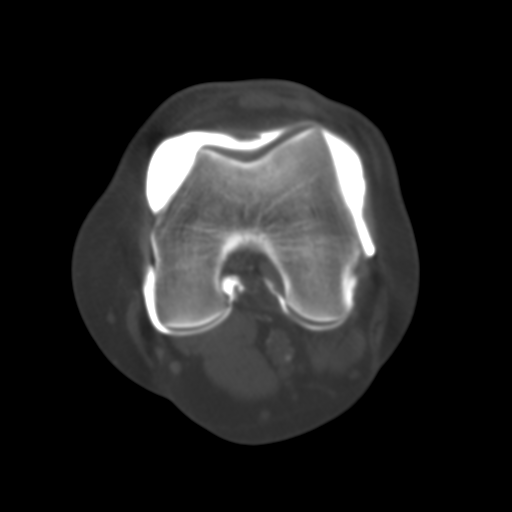
[im 41/77  bone]
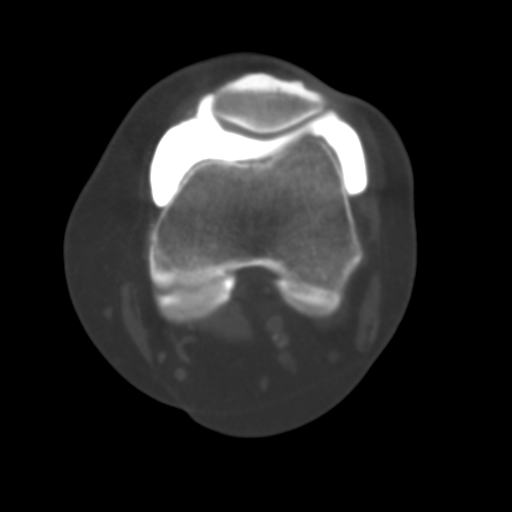
[im 47/77  bone]
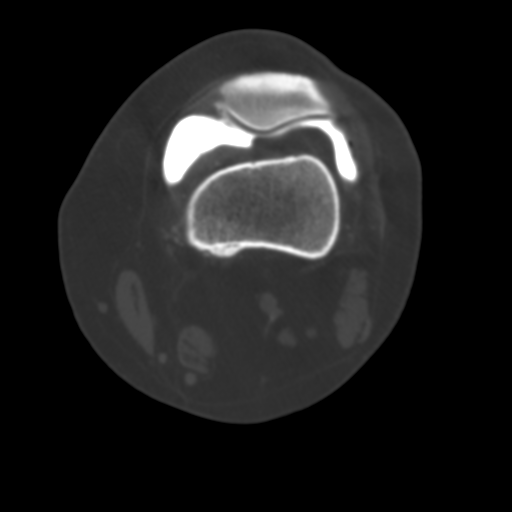
[im 53/77  soft-tissue]
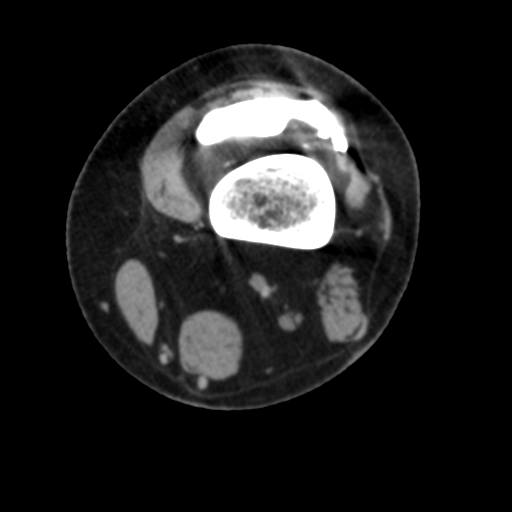
[im 53/77  bone]
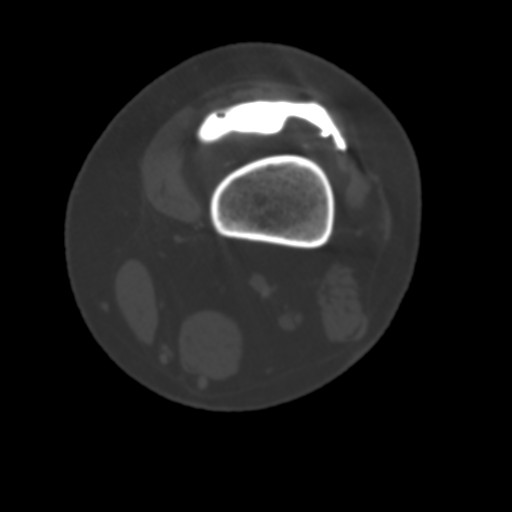
[im 59/77  bone]
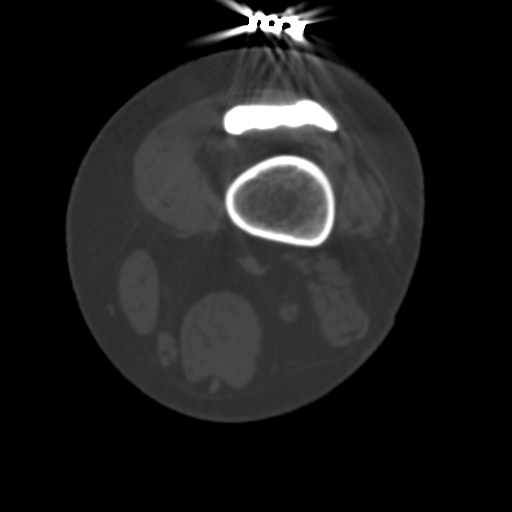
[im 65/77  bone]
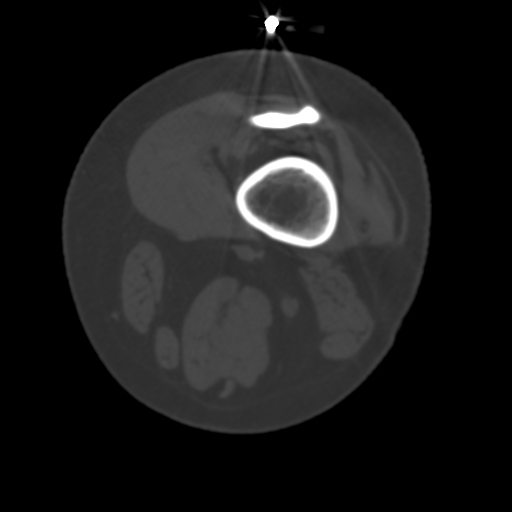
[im 71/77  bone]
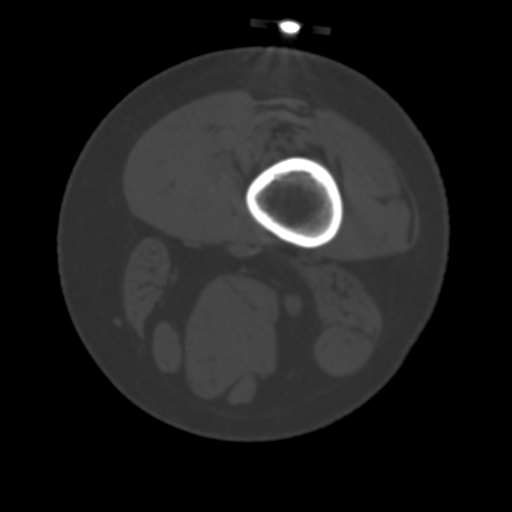

[12 of 14 positions shown; findings below may reference images not displayed]

FINDINGS: MENISCI

Medial: Severe attenuation of the root of the posterior horn of the
medial meniscus concerning for a radial tear of the root of the
posterior horn of the medial meniscus with peripheral meniscal
extrusion.

Lateral: Intact.

LIGAMENTS

Cruciates: ACL and PCL are intact.

Collaterals: Medial collateral ligament is intact. Lateral
collateral ligament complex is intact.

CARTILAGE

Patellofemoral:  No chondral defect.

Medial: Mild partial-thickness cartilage loss of the medial
femorotibial compartment.

Lateral:  No chondral defect.

JOINT: Intraarticular contrast distending the joint capsule. Normal
Rafy Jara. No plical thickening.

POPLITEAL FOSSA: Popliteus tendon is intact. No Baker's cyst.

EXTENSOR MECHANISM: Intact quadriceps tendon. Intact patellar
tendon. Intact lateral patellar retinaculum. Intact medial patellar
retinaculum. Intact MPFL.

BONES: No aggressive osseous lesion. No fracture or dislocation.

Other: No fluid collection or hematoma. Muscles are normal.
IMPRESSION: 1. Severe attenuation of the root of the posterior horn of the
medial meniscus concerning for a radial tear of the root of the
posterior horn of the medial meniscus with peripheral meniscal
extrusion.
Lateral: Intact.
2. Mild partial-thickness cartilage loss of the medial femorotibial
compartment.
